# Patient Record
Sex: Male | Born: 1985 | Race: Black or African American | Hispanic: No | Marital: Single | Smoking: Current every day smoker
Health system: Southern US, Community
[De-identification: ages and names within clinical notes are randomized; demographics above are authoritative.]

## PROBLEM LIST (undated history)

## (undated) HISTORY — PX: FACIAL FRACTURE SURGERY: SHX1570

---
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
## 2006-03-25 ENCOUNTER — Encounter: Admission: RE | Admit: 2006-03-25 | Discharge: 2006-03-25 | Payer: Self-pay | Admitting: Occupational Medicine

## 2009-01-03 ENCOUNTER — Emergency Department (HOSPITAL_COMMUNITY): Admission: EM | Admit: 2009-01-03 | Discharge: 2009-01-03 | Payer: Self-pay | Admitting: Emergency Medicine

## 2010-03-30 ENCOUNTER — Inpatient Hospital Stay (HOSPITAL_COMMUNITY)
Admission: EM | Admit: 2010-03-30 | Discharge: 2010-04-01 | Payer: Self-pay | Source: Home / Self Care | Admitting: Emergency Medicine

## 2010-03-30 ENCOUNTER — Encounter: Payer: Self-pay | Admitting: Emergency Medicine

## 2010-06-29 ENCOUNTER — Emergency Department (HOSPITAL_COMMUNITY)
Admission: EM | Admit: 2010-06-29 | Discharge: 2010-06-29 | Disposition: A | Discharge status: Home / Self Care | Payer: No Typology Code available for payment source | Attending: Emergency Medicine | Admitting: Emergency Medicine

## 2010-06-29 ENCOUNTER — Emergency Department (HOSPITAL_COMMUNITY): Payer: No Typology Code available for payment source

## 2010-06-29 DIAGNOSIS — M542 Cervicalgia: Secondary | ICD-10-CM | POA: Insufficient documentation

## 2010-06-29 DIAGNOSIS — M545 Low back pain, unspecified: Secondary | ICD-10-CM | POA: Insufficient documentation

## 2010-06-29 DIAGNOSIS — R51 Headache: Secondary | ICD-10-CM | POA: Insufficient documentation

## 2010-06-29 DIAGNOSIS — M25519 Pain in unspecified shoulder: Secondary | ICD-10-CM | POA: Insufficient documentation

## 2010-07-25 LAB — CBC
HCT: 35.4 % — ABNORMAL LOW (ref 39.0–52.0)
Hemoglobin: 12.9 g/dL — ABNORMAL LOW (ref 13.0–17.0)
Hemoglobin: 11.5 g/dL — ABNORMAL LOW (ref 13.0–17.0)
MCHC: 32.8 g/dL (ref 30.0–36.0)
MCV: 84.8 fL (ref 78.0–100.0)
Platelets: 162 10*3/uL (ref 150–400)
RBC: 4.64 MIL/uL (ref 4.22–5.81)
RBC: 4.28 MIL/uL (ref 4.22–5.81)
RDW: 14.8 % (ref 11.5–15.5)
WBC: 7.5 10*3/uL (ref 4.0–10.5)
WBC: 4.8 10*3/uL (ref 4.0–10.5)

## 2010-07-25 LAB — BASIC METABOLIC PANEL
BUN: 9 mg/dL (ref 6–23)
BUN: 5 mg/dL — ABNORMAL LOW (ref 6–23)
Chloride: 107 mEq/L (ref 96–112)
Creatinine, Ser: 1.09 mg/dL (ref 0.4–1.5)
Creatinine, Ser: 0.84 mg/dL (ref 0.4–1.5)
GFR calc Af Amer: >60 mL/min (ref >60)
Glucose, Bld: 136 mg/dL — ABNORMAL HIGH (ref 70–99)
Sodium: 141 mEq/L (ref 135–145)

## 2010-07-25 LAB — DIFFERENTIAL
Basophils Relative: 0 % (ref 0–1)
Eosinophils Absolute: 0.1 10*3/uL (ref 0.0–0.7)
Eosinophils Relative: 2 % (ref 0–5)
Lymphs Abs: 2.2 10*3/uL (ref 0.7–4.0)
Monocytes Relative: 4 % (ref 3–12)
Neutrophils Relative %: 65 % (ref 43–77)

## 2010-08-19 LAB — COMPREHENSIVE METABOLIC PANEL
Albumin: 4.4 g/dL (ref 3.5–5.2)
Alkaline Phosphatase: 49 U/L (ref 39–117)
Calcium: 9.4 mg/dL (ref 8.4–10.5)
GFR calc Af Amer: >60 mL/min (ref >60)
Glucose, Bld: 97 mg/dL (ref 70–99)
Total Protein: 7.5 g/dL (ref 6.0–8.3)

## 2010-08-19 LAB — CBC
MCHC: 33.3 g/dL (ref 30.0–36.0)
MCV: 84 fL (ref 78.0–100.0)
Platelets: 150 10*3/uL (ref 150–400)
RDW: 15.7 % — ABNORMAL HIGH (ref 11.5–15.5)
WBC: 3.4 10*3/uL — ABNORMAL LOW (ref 4.0–10.5)

## 2010-08-19 LAB — DIFFERENTIAL
Basophils Absolute: 0 10*3/uL (ref 0.0–0.1)
Basophils Relative: 1 % (ref 0–1)
Eosinophils Absolute: 0.1 10*3/uL (ref 0.0–0.7)
Lymphocytes Relative: 41 % (ref 12–46)
Monocytes Relative: 6 % (ref 3–12)
Neutrophils Relative %: 50 % (ref 43–77)

## 2010-08-19 LAB — URINALYSIS, ROUTINE W REFLEX MICROSCOPIC
Bilirubin Urine: NEGATIVE
Glucose, UA: NEGATIVE mg/dL
Hgb urine dipstick: NEGATIVE
Nitrite: NEGATIVE
Specific Gravity, Urine: 1.013 (ref 1.005–1.030)
pH: 7.5 (ref 5.0–8.0)

## 2012-07-29 ENCOUNTER — Encounter (HOSPITAL_COMMUNITY): Payer: Self-pay | Admitting: Family Medicine

## 2012-07-29 ENCOUNTER — Emergency Department (HOSPITAL_COMMUNITY)
Admission: EM | Admit: 2012-07-29 | Discharge: 2012-07-29 | Disposition: A | Discharge status: Home / Self Care | Payer: Self-pay | Attending: Emergency Medicine | Admitting: Emergency Medicine

## 2012-07-29 DIAGNOSIS — N342 Other urethritis: Secondary | ICD-10-CM | POA: Insufficient documentation

## 2012-07-29 DIAGNOSIS — R300 Dysuria: Secondary | ICD-10-CM | POA: Insufficient documentation

## 2012-07-29 DIAGNOSIS — IMO0002 Reserved for concepts with insufficient information to code with codable children: Secondary | ICD-10-CM | POA: Insufficient documentation

## 2012-07-29 DIAGNOSIS — F17200 Nicotine dependence, unspecified, uncomplicated: Secondary | ICD-10-CM | POA: Insufficient documentation

## 2012-07-29 MED ORDER — CEFTRIAXONE SODIUM 250 MG IJ SOLR
250.0000 mg | Freq: Once | INTRAMUSCULAR | Status: AC
  Administered 2012-07-29: 250 mg via INTRAMUSCULAR
  Filled 2012-07-29: qty 250

## 2012-07-29 MED ORDER — AZITHROMYCIN 250 MG PO TABS
1000.0000 mg | ORAL_TABLET | Freq: Once | ORAL | Status: AC
  Administered 2012-07-29: 1000 mg via ORAL
  Filled 2012-07-29: qty 4

## 2012-07-29 NOTE — ED Provider Notes (Signed)
History     CSN: [id number]  Arrival date & time 07/29/12  0551   First MD Initiated Contact with Patient 07/29/12 0602      Chief Complaint  Patient presents with  . Penile Discharge    (Consider location/radiation/quality/duration/timing/severity/associated sxs/prior treatment) HPI Comments: Patient presents with complaint of penile discharge for the past 3 days. Patient is sexually active with a male partner. Patient states that his partner has been complaining of burning with urination for several weeks. Patient describes the discharge as brown with red tinge. He states that it burns with urination and with movement of the penile shaft. He denies skin changes. No fevers, nausea or vomiting. Onset of symptoms gradual. Course is gradually worsening. Nothing makes symptoms.  Patient is a 27 y.o. male presenting with penile discharge. The history is provided by the patient.  Penile Discharge Pertinent negatives include no arthralgias, fever, rash or sore throat.    History reviewed. No pertinent past medical history.  Past Surgical History  Procedure Laterality Date  . Facial fracture surgery      No family history on file.  History  Substance Use Topics  . Smoking status: Current Every Day Smoker -- 0.50 packs/day    Types: Cigarettes  . Smokeless tobacco: Not on file  . Alcohol Use: Yes     Comment: Occasionally      Review of Systems  Constitutional: Negative for fever.  HENT: Negative for sore throat.   Eyes: Negative for discharge.  Gastrointestinal: Negative for rectal pain.  Genitourinary: Positive for dysuria, discharge and penile pain. Negative for frequency, genital sores and testicular pain.  Musculoskeletal: Negative for arthralgias.  Skin: Negative for rash.  Hematological: Negative for adenopathy.    Allergies  Review of patient's allergies indicates no known allergies.  Home Medications   Current Outpatient Rx  Name  Route  Sig  Dispense   Refill  . Multiple Vitamin (MULTIVITAMIN WITH MINERALS) TABS   Oral   Take 1 tablet by mouth daily. OTC  One a day Men's           BP 113/68  Pulse 62  Temp(Src) 98.6 F (37 C) (Oral)  SpO2 99%  Physical Exam  Nursing note and vitals reviewed. Constitutional: He appears well-developed and well-nourished.  HENT:  Head: Normocephalic and atraumatic.  Eyes: Conjunctivae are normal.  Neck: Normal range of motion. Neck supple.  Pulmonary/Chest: No respiratory distress.  Genitourinary: Testes normal. Penile tenderness present. No penile erythema. Discharge found.  Neurological: He is alert.  Skin: Skin is warm and dry.  Psychiatric: He has a normal mood and affect.    ED Course  Procedures (including critical care time)  Labs Reviewed  GC/CHLAMYDIA PROBE AMP   No results found.   1. Urethritis     6:24 AM Patient seen and examined. Work-up initiated. Medications ordered.   Vital signs reviewed and are as follows: Filed Vitals:   07/29/12 0603  BP: 113/68  Pulse: 62  Temp: 98.6 F (37 C)   Pt seen and examined.  Will test and treat for STD exposure.  Patient counseled on safe sexual practices.  Told them that they should not have sexual contact for next 7 days and that they need to inform sexual partners so that they can get tested and treated as well.  Urged f/u with [place] [facility] for HIV and syphilis testing.  Patient verbalizes understanding and agrees with plan.     MDM  Patient with clinical  signs and symptoms of urethritis. Treated in emergency department. No systemic symptoms of illness.        Renne Crigler, PA-C 07/29/12 0630

## 2012-07-29 NOTE — ED Provider Notes (Signed)
Medical screening examination/treatment/procedure(s) were performed by non-physician practitioner and as supervising physician I was immediately available for consultation/collaboration.  Jones Skene, M.D.     Jones Skene, MD 07/29/12 [phone]

## 2012-07-29 NOTE — ED Notes (Addendum)
Patient states that he has had white penile discharge on Saturday. States that discharge changed to brown color since Saturday. Reports pain with urination and swelling around head of penis and that when he wiped he removed "a scab." Went to health department yesterday and was told that an appointment was needed. States that pain is getting worse.

## 2012-07-31 LAB — GC/CHLAMYDIA PROBE AMP: CT Probe RNA: NEGATIVE

## 2013-03-25 ENCOUNTER — Ambulatory Visit (INDEPENDENT_AMBULATORY_CARE_PROVIDER_SITE_OTHER): Payer: BC Managed Care – PPO | Admitting: Emergency Medicine

## 2013-03-25 VITALS — BP 100/62 | HR 64 | Temp 98.9°F | Resp 18 | Ht 71.0 in | Wt 141.0 lb

## 2013-03-25 DIAGNOSIS — R369 Urethral discharge, unspecified: Secondary | ICD-10-CM

## 2013-03-25 DIAGNOSIS — Z202 Contact with and (suspected) exposure to infections with a predominantly sexual mode of transmission: Secondary | ICD-10-CM

## 2013-03-25 DIAGNOSIS — Z2089 Contact with and (suspected) exposure to other communicable diseases: Secondary | ICD-10-CM

## 2013-03-25 MED ORDER — CEFTRIAXONE SODIUM 1 G IJ SOLR: 250.0000 mg | INTRAMUSCULAR | Status: DC

## 2013-03-25 MED ORDER — DOXYCYCLINE HYCLATE 100 MG PO CAPS: 100.0000 mg | ORAL_CAPSULE | Freq: Two times a day (BID) | ORAL | Status: DC

## 2013-03-25 MED ORDER — CEFTRIAXONE SODIUM 1 G IJ SOLR: 250.0000 mg | Freq: Once | INTRAMUSCULAR | Status: DC

## 2013-03-25 MED ORDER — CEFTRIAXONE SODIUM 1 G IJ SOLR
250.0000 mg | Freq: Once | INTRAMUSCULAR | Status: AC
  Administered 2013-03-25: 250 mg via INTRAMUSCULAR

## 2013-03-25 NOTE — Progress Notes (Signed)
[facility]: [address] [place] [address] [phone]  Date:  03/25/2013   Name:  Terry Maddox   DOB:  1985-07-09   MRN:  [id number]  PCP:  No PCP Per Patient    Chief Complaint: discharge from penis and tingle after urination   History of Present Illness:  Terry Maddox is a 27 y.o. very pleasant male patient who presents with the following:  Has discharge for 1-2 weeks.  No fever or chills, no rash or genital lesions.  Sexually active without condoms.  Prior history of STD.  Some dysuria.  No improvement with over the counter medications or other home remedies. Denies other complaint or health concern today.   There are no active problems to display for this patient.   History reviewed. No pertinent past medical history.  Past Surgical History  Procedure Laterality Date  . Facial fracture surgery      History  Substance Use Topics  . Smoking status: Current Every Day Smoker -- 0.50 packs/day    Types: Cigarettes  . Smokeless tobacco: Not on file  . Alcohol Use: Yes     Comment: Occasionally    History reviewed. No pertinent family history.  No Known Allergies  Medication list has been reviewed and updated.  Current Outpatient Prescriptions on File Prior to Visit  Medication Sig Dispense Refill  . Multiple Vitamin (MULTIVITAMIN WITH MINERALS) TABS Take 1 tablet by mouth daily. OTC  One a day Men's       No current facility-administered medications on file prior to visit.    Review of Systems:  As per HPI, otherwise negative.    Physical Examination: Filed Vitals:   03/25/13 1504  BP: 100/62  Pulse: 64  Temp: 98.9 F (37.2 C)  Resp: 18   Filed Vitals:   03/25/13 1504  Height: 5\' 11"  (1.803 m)  Weight: 141 lb (63.957 kg)   Body mass index is 19.67 kg/(m^2). Ideal Body Weight: Weight in (lb) to have BMI = 25: 178.9   GEN: WDWN, NAD, Non-toxic, Alert & Oriented x 3 HEENT: Atraumatic, Normocephalic.  Ears and Nose:  No external deformity. EXTR: No clubbing/cyanosis/edema NEURO: Normal gait.  PSYCH: Normally interactive. Conversant. Not depressed or anxious appearing.  Calm demeanor.  Genitalia:  Normal male circumcised  Assessment and Plan: Urethritis STD exposure GC and CT HSV 1&2 Rocephin Doxycycline  Signed,  Phillips Odor, MD

## 2013-03-25 NOTE — Patient Instructions (Signed)
Urethritis, Adult  Urethritis is an inflammation (soreness) of the urethra (the tube exiting from the bladder). It is often caused by germs that may be spread through sexual contact.  TREATMENT   Urethritis will usually respond to antibiotics. These are medications that kill germs. Take all the medicine given to you. You may feel better in a couple days, but TAKE ALL MEDICINE or the infection may not be completely cured and may become more difficult to treat. Response can generally be expected in 7 to 10 days. You may require additional treatment after more testing.  HOME CARE INSTRUCTIONS   Not have sex until the test results are known and treatment is completed.   Know that you may be asked to notify your sex partner when your final test results are back.   Finish all medications as prescribed.   Prevent sexually transmitted infections including AIDS. Practice safe sex. Use condoms.  SEEK MEDICAL CARE IF:    Your symptoms are not improved in 2 to 3 days.   Your symptoms are getting worse.   Your develop abdominal pain.   You develop joint pain.  SEEK IMMEDIATE MEDICAL CARE IF:    You have a fever.   You develop severe pain in the belly, back or side.   You develop repeated vomiting.  TEST RESULTS  Not all test results are available during your visit. If your test results are not back during the visit, make an appointment with your caregiver to find out the results. Do not assume everything is normal if you have not heard from your caregiver or the medical facility. It is important for you to follow-up on all of your test results.  Document Released: 10/24/2000 Document Revised: 07/23/2011 Document Reviewed: 05/16/2009  [employer] Patient Information 2014 [employer].

## 2013-03-25 NOTE — Addendum Note (Signed)
Addended by: Okey Regal on: 03/25/2013 03:39 PM   Modules accepted: Orders

## 2013-03-27 ENCOUNTER — Other Ambulatory Visit: Payer: Self-pay | Admitting: Emergency Medicine

## 2013-03-28 LAB — HERPES SIMPLEX VIRUS CULTURE: Organism ID, Bacteria: DETECTED

## 2013-03-29 ENCOUNTER — Other Ambulatory Visit: Payer: Self-pay | Admitting: Emergency Medicine

## 2013-03-29 MED ORDER — VALACYCLOVIR HCL 1 G PO TABS: 1000.0000 mg | ORAL_TABLET | Freq: Every day | ORAL | Status: DC

## 2013-03-30 ENCOUNTER — Telehealth: Payer: Self-pay | Admitting: Radiology

## 2013-03-30 NOTE — Telephone Encounter (Signed)
The mother of the baby was almost certainly tested for HSV during her pregnancy.  And unless she was actively having an outbreak at the time of delivery, risk of transmission is low.  Would advise mother of baby to contact her OBGYN and/or baby's pediatrician if they have concerns

## 2013-03-30 NOTE — Telephone Encounter (Signed)
Patient called back, reports his girlfriend has had a baby recently, he is concerned about the baby. What should I advise him? 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

## 2013-03-30 NOTE — Telephone Encounter (Signed)
He is concerned about the baby contracting HSV at birth.

## 2013-03-30 NOTE — Telephone Encounter (Signed)
What is his concern?  There should be no risk of transmitting chlamydia as this is a sexually transmitted infection.  HSV is transmitted by skin to skin contact, generally only when there is an outbreak.  If he has started taking valtrex daily as Dr. Dareen Piano suggested in his lab note, his risk of outbreak and transmission should be lower.  If he has a more specific concern, I am happy to try and answer his question.

## 2013-03-30 NOTE — Telephone Encounter (Signed)
Thank you I have called him to advise.  

## 2013-03-31 ENCOUNTER — Telehealth: Payer: Self-pay | Admitting: Family Medicine

## 2013-03-31 NOTE — Telephone Encounter (Signed)
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  There is no reason for concern about passing genital herpes to family members, just sexual partners.

## 2013-03-31 NOTE — Telephone Encounter (Signed)
Patient calling to find out which herpes do he have cause he has kids and dont want to pass to family

## 2013-04-01 NOTE — Telephone Encounter (Signed)
Patient advised. He is encouraged to return to clinic for these issues, as he continues to call with questions.

## 2013-04-03 ENCOUNTER — Telehealth: Payer: Self-pay | Admitting: Family Medicine

## 2013-04-03 NOTE — Telephone Encounter (Signed)
lmom to see if patient was going to come in to talk to Dr Dareen Piano about herpes he's been calling multiple times about lab

## 2013-05-18 ENCOUNTER — Emergency Department (HOSPITAL_COMMUNITY)
Admission: EM | Admit: 2013-05-18 | Discharge: 2013-05-18 | Disposition: A | Discharge status: Home / Self Care | Payer: BC Managed Care – PPO | Attending: Emergency Medicine | Admitting: Emergency Medicine

## 2013-05-18 ENCOUNTER — Encounter (HOSPITAL_COMMUNITY): Payer: Self-pay | Admitting: Emergency Medicine

## 2013-05-18 DIAGNOSIS — J069 Acute upper respiratory infection, unspecified: Secondary | ICD-10-CM | POA: Insufficient documentation

## 2013-05-18 DIAGNOSIS — IMO0001 Reserved for inherently not codable concepts without codable children: Secondary | ICD-10-CM | POA: Insufficient documentation

## 2013-05-18 DIAGNOSIS — J029 Acute pharyngitis, unspecified: Secondary | ICD-10-CM

## 2013-05-18 DIAGNOSIS — F17200 Nicotine dependence, unspecified, uncomplicated: Secondary | ICD-10-CM | POA: Insufficient documentation

## 2013-05-18 DIAGNOSIS — R6883 Chills (without fever): Secondary | ICD-10-CM | POA: Insufficient documentation

## 2013-05-18 LAB — RAPID STREP SCREEN (MED CTR MEBANE ONLY): Streptococcus, Group A Screen (Direct): NEGATIVE

## 2013-05-18 NOTE — Discharge Instructions (Signed)
Rest, stay well hydrated. Take ibuprofen 600 mg every 6 hours. Take mucinex over the counter twice daily. Use nasal saline.  Cool Mist Vaporizers Vaporizers may help relieve the symptoms of a cough and cold. They add moisture to the air, which helps mucus to become thinner and less sticky. This makes it easier to breathe and cough up secretions. Cool mist vaporizers do not cause serious burns like hot mist vaporizers ("steamers, humidifiers"). Vaporizers have not been proved to show they help with colds. You should not use a vaporizer if you are allergic to mold.  HOME CARE INSTRUCTIONS  Follow the package instructions for the vaporizer.  Do not use anything other than distilled water in the vaporizer.  Do not run the vaporizer all of the time. This can cause mold or bacteria to grow in the vaporizer.  Clean the vaporizer after each time it is used.  Clean and dry the vaporizer well before storing it.  Stop using the vaporizer if worsening respiratory symptoms develop. Document Released: 01/26/2004 Document Revised: 12/31/2012 Document Reviewed: 09/17/2012 [facility] Patient Information 2014 [place], [place].  Salt Water Gargle This solution will help make your mouth and throat feel better. HOME CARE INSTRUCTIONS   Mix 1 teaspoon of salt in 8 ounces of warm water.  Gargle with this solution as much or often as you need or as directed. Swish and gargle gently if you have any sores or wounds in your mouth.  Do not swallow this mixture. Document Released: 02/02/2004 Document Revised: 07/23/2011 Document Reviewed: 06/25/2008 [facility] Patient Information 2014 [place], [place].  Viral and Bacterial Pharyngitis Pharyngitis is soreness (inflammation) or infection of the pharynx. It is also called a sore throat. CAUSES  Most sore throats are caused by viruses and are part of a cold. However, some sore throats are caused by strep and other bacteria. Sore throats can also be caused by post nasal  drip from draining sinuses, allergies and sometimes from sleeping with an open mouth. Infectious sore throats can be spread from person to person by coughing, sneezing and sharing cups or eating utensils. TREATMENT  Sore throats that are viral usually last 3-4 days. Viral illness will get better without medications (antibiotics). Strep throat and other bacterial infections will usually begin to get better about 24-48 hours after you begin to take antibiotics. HOME CARE INSTRUCTIONS   If the caregiver feels there is a bacterial infection or if there is a positive strep test, they will prescribe an antibiotic. The full course of antibiotics must be taken. If the full course of antibiotic is not taken, you or your child may become ill again. If you or your child has strep throat and do not finish all of the medication, serious heart or kidney diseases may develop.  Drink enough water and fluids to keep your urine clear or pale yellow.  Only take over-the-counter or prescription medicines for pain, discomfort or fever as directed by your caregiver.  Get lots of rest.  Gargle with salt water ( tsp. of salt in a glass of water) as often as every 1-2 hours as you need for comfort.  Hard candies may soothe the throat if individual is not at risk for choking. Throat sprays or lozenges may also be used. SEEK MEDICAL CARE IF:   Large, tender lumps in the neck develop.  A rash develops.  Green, yellow-brown or bloody sputum is coughed up.  Your baby is older than 3 months with a rectal temperature of 100.5 F (38.1 C)  or higher for more than 1 day. SEEK IMMEDIATE MEDICAL CARE IF:   A stiff neck develops.  You or your child are drooling or unable to swallow liquids.  You or your child are vomiting, unable to keep medications or liquids down.  You or your child has severe pain, unrelieved with recommended medications.  You or your child are having difficulty breathing (not due to stuffy  nose).  You or your child are unable to fully open your mouth.  You or your child develop redness, swelling, or severe pain anywhere on the neck.  You have a fever.  Your baby is older than 3 months with a rectal temperature of 102 F (38.9 C) or higher.  Your baby is 75 months old or younger with a rectal temperature of 100.4 F (38 C) or higher. MAKE SURE YOU:   Understand these instructions.  Will watch your condition.  Will get help right away if you are not doing well or get worse. Document Released: 04/30/2005 Document Revised: 07/23/2011 Document Reviewed: 07/28/2007 [facility] Patient Information 2014 [place], [place].  Upper Respiratory Infection, Adult An upper respiratory infection (URI) is also sometimes known as the common cold. The upper respiratory tract includes the nose, sinuses, throat, trachea, and bronchi. Bronchi are the airways leading to the lungs. Most people improve within 1 week, but symptoms can last up to 2 weeks. A residual cough may last even longer.  CAUSES Many different viruses can infect the tissues lining the upper respiratory tract. The tissues become irritated and inflamed and often become very moist. Mucus production is also common. A cold is contagious. You can easily spread the virus to others by oral contact. This includes kissing, sharing a glass, coughing, or sneezing. Touching your mouth or nose and then touching a surface, which is then touched by another person, can also spread the virus. SYMPTOMS  Symptoms typically develop 1 to 3 days after you come in contact with a cold virus. Symptoms vary from person to person. They may include:  Runny nose.  Sneezing.  Nasal congestion.  Sinus irritation.  Sore throat.  Loss of voice (laryngitis).  Cough.  Fatigue.  Muscle aches.  Loss of appetite.  Headache.  Low-grade fever. DIAGNOSIS  You might diagnose your own cold based on familiar symptoms, since most people get a cold 2 to  3 times a year. Your caregiver can confirm this based on your exam. Most importantly, your caregiver can check that your symptoms are not due to another disease such as strep throat, sinusitis, pneumonia, asthma, or epiglottitis. Blood tests, throat tests, and X-rays are not necessary to diagnose a common cold, but they may sometimes be helpful in excluding other more serious diseases. Your caregiver will decide if any further tests are required. RISKS AND COMPLICATIONS  You may be at risk for a more severe case of the common cold if you smoke cigarettes, have chronic heart disease (such as heart failure) or lung disease (such as asthma), or if you have a weakened immune system. The very young and very old are also at risk for more serious infections. Bacterial sinusitis, middle ear infections, and bacterial pneumonia can complicate the common cold. The common cold can worsen asthma and chronic obstructive pulmonary disease (COPD). Sometimes, these complications can require emergency medical care and may be life-threatening. PREVENTION  The best way to protect against getting a cold is to practice good hygiene. Avoid oral or hand contact with people with cold symptoms. Wash your hands  often if contact occurs. There is no clear evidence that vitamin C, vitamin E, echinacea, or exercise reduces the chance of developing a cold. However, it is always recommended to get plenty of rest and practice good nutrition. TREATMENT  Treatment is directed at relieving symptoms. There is no cure. Antibiotics are not effective, because the infection is caused by a virus, not by bacteria. Treatment may include:  Increased fluid intake. Sports drinks offer valuable electrolytes, sugars, and fluids.  Breathing heated mist or steam (vaporizer or shower).  Eating chicken soup or other clear broths, and maintaining good nutrition.  Getting plenty of rest.  Using gargles or lozenges for comfort.  Controlling fevers with  ibuprofen or acetaminophen as directed by your caregiver.  Increasing usage of your inhaler if you have asthma. Zinc gel and zinc lozenges, taken in the first 24 hours of the common cold, can shorten the duration and lessen the severity of symptoms. Pain medicines may help with fever, muscle aches, and throat pain. A variety of non-prescription medicines are available to treat congestion and runny nose. Your caregiver can make recommendations and may suggest nasal or lung inhalers for other symptoms.  HOME CARE INSTRUCTIONS   Only take over-the-counter or prescription medicines for pain, discomfort, or fever as directed by your caregiver.  Use a warm mist humidifier or inhale steam from a shower to increase air moisture. This may keep secretions moist and make it easier to breathe.  Drink enough water and fluids to keep your urine clear or pale yellow.  Rest as needed.  Return to work when your temperature has returned to normal or as your caregiver advises. You may need to stay home longer to avoid infecting others. You can also use a face mask and careful hand washing to prevent spread of the virus. SEEK MEDICAL CARE IF:   After the first few days, you feel you are getting worse rather than better.  You need your caregiver's advice about medicines to control symptoms.  You develop chills, worsening shortness of breath, or brown or red sputum. These may be signs of pneumonia.  You develop yellow or brown nasal discharge or pain in the face, especially when you bend forward. These may be signs of sinusitis.  You develop a fever, swollen neck glands, pain with swallowing, or white areas in the back of your throat. These may be signs of strep throat. SEEK IMMEDIATE MEDICAL CARE IF:   You have a fever.  You develop severe or persistent headache, ear pain, sinus pain, or chest pain.  You develop wheezing, a prolonged cough, cough up blood, or have a change in your usual mucus (if you have  chronic lung disease).  You develop sore muscles or a stiff neck. Document Released: 10/24/2000 Document Revised: 07/23/2011 Document Reviewed: 09/01/2010 [facility] Patient Information 2014 [place], [place].

## 2013-05-18 NOTE — ED Notes (Signed)
Pt in c/o sore throat x2 days with body aches and chills

## 2013-05-18 NOTE — ED Notes (Signed)
Pt states understanding of discharge instructions 

## 2013-05-18 NOTE — ED Provider Notes (Signed)
Medical screening examination/treatment/procedure(s) were performed by non-physician practitioner and as supervising physician I was immediately available for consultation/collaboration.  EKG Interpretation   None         Loren Raceravid Haizley Cannella, MD 05/18/13 2315

## 2013-05-18 NOTE — ED Provider Notes (Signed)
CSN: [id number]     Arrival date & time 05/18/13  0239 History   First MD Initiated Contact with Patient 05/18/13 [phone]     Chief Complaint  Patient presents with  . Sore Throat   (Consider location/radiation/quality/duration/timing/severity/associated sxs/prior Treatment) HPI Comments: Patient is a 28 year old male who presents to the emergency department complaining of sore throat x3-4 days. Admits to associated nasal congestion, postnasal drip, body aches and chills. Denies fever. He is a slight nonproductive cough which began today. He has tried taking multiple over-the-counter medications without any relief. Denies sick contacts.  Patient is a 28 y.o. male presenting with pharyngitis. The history is provided by the patient.  Sore Throat Associated symptoms include arthralgias, chills, congestion, coughing, myalgias and a sore throat. Pertinent negatives include no fever.    History reviewed. No pertinent past medical history. Past Surgical History  Procedure Laterality Date  . Facial fracture surgery     History reviewed. No pertinent family history. History  Substance Use Topics  . Smoking status: Current Every Day Smoker -- 0.50 packs/day    Types: Cigarettes  . Smokeless tobacco: Not on file  . Alcohol Use: Yes     Comment: Occasionally    Review of Systems  Constitutional: Positive for chills. Negative for fever.  HENT: Positive for congestion and sore throat.   Respiratory: Positive for cough.   Musculoskeletal: Positive for arthralgias and myalgias.    Allergies  Review of patient's allergies indicates no known allergies.  Home Medications   Current Outpatient Rx  Name  Route  Sig  Dispense  Refill  . doxycycline (VIBRAMYCIN) 100 MG capsule   Oral   Take 1 capsule (100 mg total) by mouth 2 (two) times daily.   20 capsule   0   . Multiple Vitamin (MULTIVITAMIN WITH MINERALS) TABS   Oral   Take 1 tablet by mouth daily. OTC  One a day Men's         .  valACYclovir (VALTREX) 1000 MG tablet   Oral   Take 1 tablet (1,000 mg total) by mouth daily.   30 tablet   12    BP 131/77  Pulse 80  Temp(Src) 98.6 F (37 C) (Oral)  Resp 20  Wt 145 lb (65.772 kg)  SpO2 100% Physical Exam  Nursing note and vitals reviewed. Constitutional: He is oriented to person, place, and time. He appears well-developed and well-nourished. No distress.  HENT:  Head: Normocephalic and atraumatic.  Right Ear: Tympanic membrane and ear canal normal.  Left Ear: Tympanic membrane and ear canal normal.  Nose: Mucosal edema present.  Mouth/Throat: Uvula is midline and mucous membranes are normal. Posterior oropharyngeal erythema present. No oropharyngeal exudate or posterior oropharyngeal edema.  Post nasal drip.  Eyes: Conjunctivae are normal.  Neck: Normal range of motion. Neck supple.  Cardiovascular: Normal rate, regular rhythm and normal heart sounds.   Pulmonary/Chest: Effort normal and breath sounds normal.  Musculoskeletal: Normal range of motion. He exhibits no edema.  Lymphadenopathy:    He has no cervical adenopathy.  Neurological: He is alert and oriented to person, place, and time.  Skin: Skin is warm and dry. He is not diaphoretic.  Psychiatric: He has a normal mood and affect. His behavior is normal.    ED Course  Procedures (including critical care time) Labs Review Labs Reviewed  RAPID STREP SCREEN  CULTURE, GROUP A STREP   Imaging Review No results found.  EKG Interpretation   None  MDM   1. URI (upper respiratory infection)   2. Pharyngitis    Pt presenting with sore throat, URI s/s. He is well appearing, NAD, normal VS. Rapid strep negative. Lungs clear. Discussed symptomatic tx. Return precautions given. Patient states understanding of treatment care plan and is agreeable.     Trevor MaceRobyn M Albert, PA-C 05/18/13 [phone]

## 2013-05-19 LAB — CULTURE, GROUP A STREP

## 2015-05-07 ENCOUNTER — Emergency Department (HOSPITAL_COMMUNITY)
Admission: EM | Admit: 2015-05-07 | Discharge: 2015-05-07 | Disposition: A | Discharge status: Home / Self Care | Payer: PRIVATE HEALTH INSURANCE | Attending: Emergency Medicine | Admitting: Emergency Medicine

## 2015-05-07 ENCOUNTER — Encounter (HOSPITAL_COMMUNITY): Payer: Self-pay | Admitting: *Deleted

## 2015-05-07 ENCOUNTER — Emergency Department (HOSPITAL_COMMUNITY): Payer: PRIVATE HEALTH INSURANCE

## 2015-05-07 DIAGNOSIS — Y9289 Other specified places as the place of occurrence of the external cause: Secondary | ICD-10-CM | POA: Diagnosis not present

## 2015-05-07 DIAGNOSIS — F17210 Nicotine dependence, cigarettes, uncomplicated: Secondary | ICD-10-CM | POA: Diagnosis not present

## 2015-05-07 DIAGNOSIS — Y9301 Activity, walking, marching and hiking: Secondary | ICD-10-CM | POA: Diagnosis not present

## 2015-05-07 DIAGNOSIS — Z79899 Other long term (current) drug therapy: Secondary | ICD-10-CM | POA: Insufficient documentation

## 2015-05-07 DIAGNOSIS — W19XXXA Unspecified fall, initial encounter: Secondary | ICD-10-CM

## 2015-05-07 DIAGNOSIS — Y998 Other external cause status: Secondary | ICD-10-CM | POA: Diagnosis not present

## 2015-05-07 DIAGNOSIS — S6991XA Unspecified injury of right wrist, hand and finger(s), initial encounter: Secondary | ICD-10-CM | POA: Diagnosis present

## 2015-05-07 DIAGNOSIS — W108XXA Fall (on) (from) other stairs and steps, initial encounter: Secondary | ICD-10-CM | POA: Insufficient documentation

## 2015-05-07 DIAGNOSIS — M79644 Pain in right finger(s): Secondary | ICD-10-CM

## 2015-05-07 DIAGNOSIS — Y9389 Activity, other specified: Secondary | ICD-10-CM | POA: Insufficient documentation

## 2015-05-07 MED ORDER — KETOROLAC TROMETHAMINE 60 MG/2ML IM SOLN
60.0000 mg | Freq: Once | INTRAMUSCULAR | Status: AC
  Administered 2015-05-07: 60 mg via INTRAMUSCULAR
  Filled 2015-05-07: qty 2

## 2015-05-07 MED ORDER — IBUPROFEN 800 MG PO TABS: 800.0000 mg | ORAL_TABLET | Freq: Three times a day (TID) | ORAL | Status: DC

## 2015-05-07 MED ORDER — OXYCODONE-ACETAMINOPHEN 5-325 MG PO TABS
2.0000 | ORAL_TABLET | Freq: Once | ORAL | Status: AC
  Administered 2015-05-07: 2 via ORAL
  Filled 2015-05-07: qty 2

## 2015-05-07 MED ORDER — OXYCODONE-ACETAMINOPHEN 5-325 MG PO TABS: 1.0000 | ORAL_TABLET | ORAL | Status: DC | PRN

## 2015-05-07 NOTE — ED Notes (Signed)
Declined W/C at D/C and was escorted to lobby by RN. 

## 2015-05-07 NOTE — ED Notes (Signed)
Pt reports he slipped and fell down stairs ,but can not tell how many steeps he fell down. Pt now has pain to RT thumb

## 2015-05-07 NOTE — Discharge Instructions (Signed)
You have been seen today for a fall and thumb pain. Your imaging showed no abnormalities. Follow up with PCP as needed. Return to ED should symptoms worsen. Follow-up with orthopedics if symptoms do not improve. Apply ice and 15 minute intervals to help with inflammation. Ibuprofen or naproxen can be used for pain and swelling.   Emergency Department Resource Guide 1) Find a Doctor and Pay Out of Pocket Although you won't have to find out who is covered by your insurance plan, it is a good idea to ask around and get recommendations. You will then need to call the office and see if the doctor you have chosen will accept you as a new patient and what types of options they offer for patients who are self-pay. Some doctors offer discounts or will set up payment plans for their patients who do not have insurance, but you will need to ask so you aren't surprised when you get to your appointment.  2) Contact Your Local Health Department Not all health departments have doctors that can see patients for sick visits, but many do, so it is worth a call to see if yours does. If you don't know where your local health department is, you can check in your phone book. The CDC also has a tool to help you locate your state's health department, and many state websites also have listings of all of their local health departments.  3) Find a Walk-in Clinic If your illness is not likely to be very severe or complicated, you may want to try a walk in clinic. These are popping up all over the country in pharmacies, drugstores, and shopping centers. They're usually staffed by nurse practitioners or physician assistants that have been trained to treat common illnesses and complaints. They're usually fairly quick and inexpensive. However, if you have serious medical issues or chronic medical problems, these are probably not your best option.  No Primary Care Doctor: - Call Health Connect at  [phone] - they can help you locate a  primary care doctor that  accepts your insurance, provides certain services, etc. - Physician Referral Service- [phone]  Chronic Pain Problems: Organization         Address  Phone   Notes  [facility]  [phone] Patients need to be referred by their primary care doctor.   Medication Assistance: Organization         Address  Phone   Notes  [facility] [address] [place], [place] [address] [phone] --Must be a resident of [place] -- Must have NO insurance coverage whatsoever (no Medicaid/ Medicare, etc.) -- The pt. MUST have a primary care doctor that directs their care regularly and follows them in the community   MedAssist  [phone]   [employer]  [phone]    Agencies that provide inexpensive medical care: Organization         Address  Phone   Notes  [facility]  [phone]   [facility]    [phone]   [facility] [address] [place], [place] [address] [phone]   [facility] [address], [place] [phone]   [facility]anned [facility]    [phone]   [facility]    [phone]   [facility] and [facility]  [address], [place] Phone:  [phone], Fax:  [phone])  [phone] Hours of Operation:  9 am - 6 pm, M-F.  Also accepts Medicaid/Medicare and self-pay.  [facility] for [place], [address], [place] Phone: [phone], Fax: [phone]. Hours of Operation:  8:30 am - 5:30 pm, M-F.  Also accepts Medicaid and self-pay.  [facility] [place] [address], [place] Phone: [phone]   [place], [place], [place] [phone], Ext. [phone] Mondays & Thursdays: 7-9 AM.  First 15 patients are seen on a first come, first serve basis.    [place]  Providers:  Organization         Address  Phone   Notes  [facility] [address], [place] [phone] Also accepts self-pay patients.  [facility] [address] [place], [place]  [phone]   [place], [address], [place] [phone]   [facility] [address], [place] [phone]   Lucianne Lei [address], [place]   [phone] Only accepts [place] Access [place] patients after they have their name applied to their card.   Self-Pay (no insurance) in [facility]:  Organization         Address  Phone   Notes  Sickle Cell Patients, [facility] Internal [facility] [place] [phone]   [facility] [place] [phone]   [facility] [place]  [place], [place], [place] [phone]   [facility]/Dr. Osei-Bonsu  [address], [place] or [address], [place] [phone] Phone number for both [place] and [place] locations is the same.  Urgent Medical and [facility] [address] [place] [phone]   [facility] [address] [place] or [address] [phone]   [facility] [address], [place] [phone], phone; [phone], fax Sees patients 1st and 3rd Saturday of every month.  Must not qualify for public or private insurance (i.e. Medicaid, Medicare, [place] Health Choice, Veterans' Benefits)  Household income should be no more than 200% of the poverty level The clinic cannot treat you if you are pregnant or think you are pregnant  Sexually transmitted diseases are not treated at the clinic.    Dental Care: Organization         Address  Phone  Notes  [facility] [place] [phone] Accepts children up to age 33 who are enrolled in [place] or [place]; pregnant women with a Medicaid card; and children who have applied for Medicaid or [place] Health Choice, but were declined, whose parents can pay a reduced fee at time of service.  [facility] Department of [facility]  [address], [place] [phone] Accepts children up to age 77 who are enrolled in [place] or [place]; pregnant women with a Medicaid card; and children who have applied for Medicaid or  Health Choice, but were declined, whose parents can pay a reduced fee at time of service.  [facility] Adult [facility] Access PROGRAM  [place] [phone] Patients are seen by appointment only. Walk-ins are not accepted. [place]  will see patients 14 years of age and older. Monday - Tuesday (8am-5pm) Most Wednesdays (8:30-5pm) $30 per visit, cash only  [facility] Adult Dental Access PROGRAM  [address], [facility] [phone] Patients are seen by appointment only. Walk-ins are not accepted. [place] will see patients 45 years of age and older. One Wednesday Evening (Monthly: Volunteer Based).  $30 per visit, cash only  [place]  [phone] for adults; Children under age 38, call Graduate Pediatric Dentistry at [phone]. Children aged 29-14, please call [phone] to request a pediatric application.  Dental services are provided in all areas of dental care including fillings, crowns and bridges, complete and partial dentures, implants, gum treatment, root canals, and extractions. Preventive care is also provided. Treatment is provided to both adults and children. Patients are selected via a lottery and there is often a waiting list.   [facility] [address], [place]  [phone] [email or web address]   [facility]  [address] [place], [place] [phone], Ext. [phone] Second and Fourth Thursday of each month, opens at 6:30 AM; Clinic ends at 9 AM.  Patients are seen on a first-come first-served basis, and a limited number are seen during each clinic.   [facility]  [address] Hillard Danker [place], [place] [phone]   Eligibility Requirements You must have lived in [place], [place], or [place] counties for at least the last three months.   You cannot be eligible for state or federal sponsored [employer], including [employer], [place], or [employer].   You generally cannot be eligible for healthcare insurance through your employer.    How to apply: Eligibility screenings are held every Tuesday and Wednesday afternoon from 1:00 pm until 4:00 pm. You do not need an appointment for the interview!  [facility] [address] [place], [place]   [place]  Welcome Department  [place]  [phone]    Behavioral Health Resources in the Community: Intensive Outpatient Programs Organization         Address  Phone  Notes  [place]. [address] [place], [place] [phone]   [facility] Outpatient [address] [place], [place]   ADS: Alcohol & Drug Svcs [address] [place], [place]   [place] [address]  [place], [place] or [phone]   Substance Abuse Resources Organization         Address  Phone  Notes  Alcohol and Drug Services  [phone]   [place]  [phone]   The [place]   Chinita Pester  [phone]   Residential & Outpatient Substance Abuse Program  [phone]   Psychological Services Organization         Address  Phone  Notes  [facility] [place]  [place]  [phone]   [place] [address], [place] or [phone]    Mobile Crisis Teams Organization         Address  Phone  Notes  [facility], Mobile Crisis Care Unit  [phone]   [facility]ive Psychotherapeutic Services  [address] [place], [place]   [facility] [address] [place] [phone]    Self-Help/Support Groups Organization  Address  Phone             Notes  [place]. of [place] - variety of support groups  [place] Call for more information  Narcotics Anonymous (NA), Caring Services [address], [employer] [place]  2 meetings at this location   Special educational needs teacher         Address  Phone  Notes  ASAP Residential Treatment [place],    [place]  1-3307155621   [facility]  [address], [place] [address]9924, [place], [place]   [place], [place] [phone] Admissions: 8am-3pm M-F  Incentives Substance [place] [address],    [place], [place] [phone]   [facility] [address] [place], [place], [place]   [facility] [address],  [place], [place]   [facility] Programs - Intensive Outpatient [address], [place], [place]   [facility] ([place]  [place], [place] [phone] or [phone]   [facility] Services (RTS) [address] [place], [place] Accepts Medicaid  [facility] [place] [address]  [place] [phone] Substance Abuse/Addiction Treatment   [facility] Organization         Address  Phone  Notes  [facility]  [phone]   Domenic Schwab, PhD [address] Arlis Porta [place], [place]   [address] or [phone]    [place], [place] [phone]   [facility] [address], [place], [place] [phone] Insurance/Medicaid/sponsorship through [facility] and Families [address], [address] [place]                                    [place], [place] [phone] [place] [address], [place] [phone]    Dr. Adele Schilder  [phone]   [facility] Dept. 1) [address], [place] 2) [place] 3)  [place] [address], [place] [phone]  [phone]   [place] [phone] or [phone] (After Hours)

## 2015-05-07 NOTE — ED Notes (Signed)
Patient transported to X-ray 

## 2015-05-07 NOTE — ED Provider Notes (Signed)
CSN: [id number]     Arrival date & time 05/07/15  4098 History  By signing my name below, I, Terry Maddox, attest that this documentation has been prepared under the direction and in the presence of Shawn C. Joy, PA-C. Electronically Signed: Placido Maddox, ED Scribe. 05/07/2015. 9:41 AM.   Chief Complaint  Patient presents with  . Hand Injury   The history is provided by the patient. No language interpreter was used.    HPI Comments: Terry Maddox is a 29 y.o. male who presents to the Emergency Department complaining of constant, 8/10, non-radiating, throbbing, right thumb pain with onset earlier today. Pt notes that he was walking down stairs, slipped and fell down about 3 stairs landing on the effected outstretched hand. He has a small scab to his right index finger which he says was present before the fall and reopened with minimal bleeding present. Pt notes DROM and worsening pain with any movement. He denies taking anything for his symptoms. Pt denies any known drug allergies. He denies head trauma, LOC, N/V, neurologic deficits, or any other pain or complaints.   History reviewed. No pertinent past medical history. Past Surgical History  Procedure Laterality Date  . Facial fracture surgery     History reviewed. No pertinent family history. Social History  Substance Use Topics  . Smoking status: Current Every Day Smoker -- 0.50 packs/day    Types: Cigarettes  . Smokeless tobacco: None  . Alcohol Use: Yes     Comment: Occasionally    Review of Systems  Gastrointestinal: Negative for nausea and vomiting.  Musculoskeletal: Positive for joint swelling (Right thumb) and arthralgias (Right thumb).  Skin: Positive for wound.  Neurological: Negative for syncope and headaches.   Allergies  Review of patient's allergies indicates no known allergies.  Home Medications   Prior to Admission medications   Medication Sig Start Date End Date Taking? Authorizing Provider   valACYclovir (VALTREX) 1000 MG tablet Take 1 tablet (1,000 mg total) by mouth daily. 03/29/13  Yes Carmelina Dane, MD  ibuprofen (ADVIL,MOTRIN) 800 MG tablet Take 1 tablet (800 mg total) by mouth 3 (three) times daily. 05/07/15   Shawn C Joy, PA-C  oxyCODONE-acetaminophen (PERCOCET/ROXICET) 5-325 MG tablet Take 1-2 tablets by mouth every 4 (four) hours as needed for severe pain. 05/07/15   Shawn C Joy, PA-C   BP 128/78 mmHg  Pulse 90  Temp(Src) 99.3 F (37.4 C) (Oral)  Resp 16  Ht  (1.803 m)  Wt 65.772 kg  BMI 20.23 kg/m2  SpO2 99% Physical Exam  Constitutional: He appears well-developed and well-nourished.  HENT:  Head: Normocephalic and atraumatic.  Mouth/Throat: No oropharyngeal exudate.  Eyes: Conjunctivae are normal. Pupils are equal, round, and reactive to light.  Neck: Normal range of motion. Neck supple.  Cardiovascular: Normal rate.   Pulmonary/Chest: Effort normal. No respiratory distress.  Musculoskeletal: He exhibits edema and tenderness (Right thumb).  Limited ROM of right thumb due to pain; full range of motion in the rest of patient's hand and fingers. Strength 5/5 in right hand and fingers; movement of wrist elicits pain at base of the thumb; cap refill present; no neuro deficits. Full ROM in all extremities and spine. No paraspinal tenderness.   Neurological: He is alert.  No sensory deficits.  Skin: Skin is warm and dry. He is not diaphoretic.  Psychiatric: He has a normal mood and affect. His behavior is normal.  Nursing note and vitals reviewed.  ED Course  Procedures  DIAGNOSTIC STUDIES: Oxygen Saturation is 98% on RA, normal by my interpretation.    COORDINATION OF CARE: 9:37 AM Pt presents today due to associated right thumb pain from a fall. Discussed next steps including ice, percocet, toradol, DG's of the right hand and wrist and reevaluation based on results of the imaging.   Labs Review Labs Reviewed - No data to display  Imaging  Review Dg Wrist Complete Right  05/07/2015  CLINICAL DATA:  Status post fall with right wrist pain EXAM: RIGHT WRIST - COMPLETE 3+ VIEW COMPARISON:  None. FINDINGS: There is no evidence of fracture or dislocation. There is no evidence of arthropathy or other focal bone abnormality. Soft tissues are unremarkable. IMPRESSION: Negative. Electronically Signed   By: Sherian ReinWei-Chen  Lin M.D.   On: 05/07/2015 10:14   Dg Hand Complete Right  05/07/2015  CLINICAL DATA:  29 year old male with right hand and wrist pain after falling down the stairs EXAM: RIGHT HAND - COMPLETE 3+ VIEW COMPARISON:  Concurrently obtained radiographs of the right wrist FINDINGS: There is no evidence of fracture or dislocation. There is no evidence of arthropathy or other focal bone abnormality. Soft tissues are unremarkable. IMPRESSION: Negative. Electronically Signed   By: Malachy MoanHeath  McCullough M.D.   On: 05/07/2015 10:20   I have personally reviewed and evaluated these images as part of my medical decision-making.   EKG Interpretation None      MDM   Final diagnoses:  Thumb pain, right  Fall, initial encounter   [name] presents with right thumb pain following a fall.  Patient has isolated trauma to the right thumb and possibly the right wrist. Patient has no neurologic deficits or true functional deficits. Patient X-Ray negative for fracture or dislocation.  Pt advised to follow up with orthopedics outpatient. Patient given brace while in ED, conservative therapy recommended and discussed. Patient will be discharged home & is agreeable with above plan. Return precautions discussed. Pt appears safe for discharge.  I personally performed the services described in this documentation, which was scribed in my presence. The recorded information has been reviewed and is accurate.   Anselm PancoastShawn C Joy, PA-C 05/07/15 1818  Doug SouSam Jacubowitz, MD 05/08/15 [phone]

## 2015-05-07 NOTE — Progress Notes (Signed)
Orthopedic Tech Progress Note Patient Details:  Miguel DibbleDonte C Hallberg 1986-02-13 [id number]  Ortho Devices Type of Ortho Device: Thumb velcro splint Ortho Device/Splint Location: rue Ortho Device/Splint Interventions: Application   Mardene Lessig 05/07/2015, 11:10 AM

## 2015-05-07 NOTE — ED Notes (Signed)
SEE PA assessment 

## 2015-07-01 ENCOUNTER — Encounter (HOSPITAL_COMMUNITY): Payer: Self-pay | Admitting: *Deleted

## 2015-07-01 ENCOUNTER — Emergency Department (HOSPITAL_COMMUNITY)
Admission: EM | Admit: 2015-07-01 | Discharge: 2015-07-01 | Disposition: A | Discharge status: Home / Self Care | Payer: PRIVATE HEALTH INSURANCE | Attending: Emergency Medicine | Admitting: Emergency Medicine

## 2015-07-01 DIAGNOSIS — J069 Acute upper respiratory infection, unspecified: Secondary | ICD-10-CM

## 2015-07-01 DIAGNOSIS — Z791 Long term (current) use of non-steroidal anti-inflammatories (NSAID): Secondary | ICD-10-CM | POA: Insufficient documentation

## 2015-07-01 DIAGNOSIS — Z0289 Encounter for other administrative examinations: Secondary | ICD-10-CM | POA: Insufficient documentation

## 2015-07-01 DIAGNOSIS — Z79899 Other long term (current) drug therapy: Secondary | ICD-10-CM | POA: Insufficient documentation

## 2015-07-01 DIAGNOSIS — R05 Cough: Secondary | ICD-10-CM | POA: Diagnosis present

## 2015-07-01 DIAGNOSIS — F17210 Nicotine dependence, cigarettes, uncomplicated: Secondary | ICD-10-CM | POA: Insufficient documentation

## 2015-07-01 NOTE — ED Notes (Signed)
Patient presents with c/o head cold that started last night.  Called work and was told he needed a work note.

## 2015-07-01 NOTE — Discharge Instructions (Signed)
Upper Respiratory Infection, Adult Most upper respiratory infections (URIs) are a viral infection of the air passages leading to the lungs. A URI affects the nose, throat, and upper air passages. The most common type of URI is nasopharyngitis and is typically referred to as "the common cold." URIs run their course and usually go away on their own. Most of the time, a URI does not require medical attention, but sometimes a bacterial infection in the upper airways can follow a viral infection. This is called a secondary infection. Sinus and middle ear infections are common types of secondary upper respiratory infections. Bacterial pneumonia can also complicate a URI. A URI can worsen asthma and chronic obstructive pulmonary disease (COPD). Sometimes, these complications can require emergency medical care and may be life threatening.  CAUSES Almost all URIs are caused by viruses. A virus is a type of germ and can spread from one person to another.  RISKS FACTORS You may be at risk for a URI if:   You smoke.   You have chronic heart or lung disease.  You have a weakened defense (immune) system.   You are very young or very old.   You have nasal allergies or asthma.  You work in crowded or poorly ventilated areas.  You work in health care facilities or schools. SIGNS AND SYMPTOMS  Symptoms typically develop 2-3 days after you come in contact with a cold virus. Most viral URIs last 7-10 days. However, viral URIs from the influenza virus (flu virus) can last 14-18 days and are typically more severe. Symptoms may include:   Runny or stuffy (congested) nose.   Sneezing.   Cough.   Sore throat.   Headache.   Fatigue.   Fever.   Loss of appetite.   Pain in your forehead, behind your eyes, and over your cheekbones (sinus pain).  Muscle aches.  DIAGNOSIS  Your health care provider may diagnose a URI by:  Physical exam.  Tests to check that your symptoms are not due to  another condition such as:  Strep throat.  Sinusitis.  Pneumonia.  Asthma. TREATMENT  A URI goes away on its own with time. It cannot be cured with medicines, but medicines may be prescribed or recommended to relieve symptoms. Medicines may help:  Reduce your fever.  Reduce your cough.  Relieve nasal congestion. HOME CARE INSTRUCTIONS   Take medicines only as directed by your health care provider.   Gargle warm saltwater or take cough drops to comfort your throat as directed by your health care provider.  Use a warm mist humidifier or inhale steam from a shower to increase air moisture. This may make it easier to breathe.  Drink enough fluid to keep your urine clear or pale yellow.   Eat soups and other clear broths and maintain good nutrition.   Rest as needed.   Return to work when your temperature has returned to normal or as your health care provider advises. You may need to stay home longer to avoid infecting others. You can also use a face mask and careful hand washing to prevent spread of the virus.  Increase the usage of your inhaler if you have asthma.   Do not use any tobacco products, including cigarettes, chewing tobacco, or electronic cigarettes. If you need help quitting, ask your health care provider. PREVENTION  The best way to protect yourself from getting a cold is to practice good hygiene.   Avoid oral or hand contact with people with cold   symptoms.   Wash your hands often if contact occurs.  There is no clear evidence that vitamin C, vitamin E, echinacea, or exercise reduces the chance of developing a cold. However, it is always recommended to get plenty of rest, exercise, and practice good nutrition.  SEEK MEDICAL CARE IF:   You are getting worse rather than better.   Your symptoms are not controlled by medicine.   You have chills.  You have worsening shortness of breath.  You have brown or red mucus.  You have yellow or brown nasal  discharge.  You have pain in your face, especially when you bend forward.  You have a fever.  You have swollen neck glands.  You have pain while swallowing.  You have white areas in the back of your throat. SEEK IMMEDIATE MEDICAL CARE IF:   You have severe or persistent:  Headache.  Ear pain.  Sinus pain.  Chest pain.  You have chronic lung disease and any of the following:  Wheezing.  Prolonged cough.  Coughing up blood.  A change in your usual mucus.  You have a stiff neck.  You have changes in your:  Vision.  Hearing.  Thinking.  Mood. MAKE SURE YOU:   Understand these instructions.  Will watch your condition.  Will get help right away if you are not doing well or get worse.   This information is not intended to replace advice given to you by your health care provider. Make sure you discuss any questions you have with your health care provider.   Document Released: 10/24/2000 Document Revised: 09/14/2014 Document Reviewed: 08/05/2013 Elsevier Interactive Patient Education 2016 [employer]  

## 2015-07-01 NOTE — ED Notes (Signed)
Patient is alert and orientedx4.  Patient was explained discharge instructions and they understood them with no questions.   

## 2015-07-01 NOTE — ED Provider Notes (Signed)
CSN: [id number]     Arrival date & time 07/01/15  2239 History  By signing my name below, I, [facility], attest that this documentation has been prepared under the direction and in the presence of [employer], PA-C. Electronically Signed: Randell Patient, ED Scribe. 07/01/2015. 11:52 PM.   Chief Complaint  Patient presents with  . URI   The history is provided by the patient. No language interpreter was used.   HPI Comments: Terry Maddox is a 30 y.o. male who presents to the Emergency Department complaining of constant, mild, unchanging rhinorrhea onset last night. Patient reports that he walked outside last night after which rhinorrhea presented, followed by congestion and cough today. 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 He is a current smoker but reports that he is attempting to quit. He denies fevers, chest pain, shortness of breath, rash, recent travel, abdominal pain, nausea, and vomiting.  History reviewed. No pertinent past medical history. Past Surgical History  Procedure Laterality Date  . Facial fracture surgery     No family history on file. Social History  Substance Use Topics  . Smoking status: Current Every Day Smoker -- 0.50 packs/day    Types: Cigarettes  . Smokeless tobacco: Never Used  . Alcohol Use: Yes     Comment: Occasionally    Review of Systems A complete 10 system review of systems was obtained and all systems are negative except as noted in the HPI and PMH.    Allergies  Review of patient's allergies indicates no known allergies.  Home Medications   Prior to Admission medications   Medication Sig Start Date End Date Taking? Authorizing Provider  ibuprofen (ADVIL,MOTRIN) 800 MG tablet Take 1 tablet (800 mg total) by mouth 3 (three) times daily. 05/07/15   Shawn C Joy, PA-C  oxyCODONE-acetaminophen (PERCOCET/ROXICET) 5-325 MG tablet Take 1-2 tablets by mouth every 4 (four) hours as  needed for severe pain. 05/07/15   Shawn C Joy, PA-C  valACYclovir (VALTREX) 1000 MG tablet Take 1 tablet (1,000 mg total) by mouth daily. 03/29/13   Carmelina Dane, MD   BP 114/80 mmHg  Pulse 86  Temp(Src) 97.9 F (36.6 C) (Oral)  Resp 18  Ht  (1.803 m)  Wt 63.504 kg  BMI 19.53 kg/m2  SpO2 96%   Physical Exam  Constitutional: He is oriented to person, place, and time. He appears well-developed and well-nourished. No distress.  HENT:  Head: Normocephalic and atraumatic.  Eyes: Conjunctivae and EOM are normal.  Neck: Neck supple. No tracheal deviation present.  Cardiovascular: Normal rate, regular rhythm, normal heart sounds and intact distal pulses.  Exam reveals no gallop and no friction rub.   No murmur heard. Pulmonary/Chest: Effort normal. No respiratory distress. He has no wheezes. He has no rales. He exhibits no tenderness.  Musculoskeletal: Normal range of motion. He exhibits no edema or tenderness.  Neurological: He is alert and oriented to person, place, and time.  Skin: Skin is warm and dry.  Psychiatric: He has a normal mood and affect. His behavior is normal.  Nursing note and vitals reviewed.   ED Course  Procedures   DIAGNOSTIC STUDIES: Oxygen Saturation is 96% on RA, adequate by my interpretation.    COORDINATION OF CARE: 11:12 PM Discussed treatment plan with pt at bedside and pt agreed to plan.   Labs Review Labs Reviewed - No data to display  Imaging Review No results  found. I have personally reviewed and evaluated these images and lab results as part of my medical decision-making.   EKG Interpretation None      MDM   Final diagnoses:  URI (upper respiratory infection)    Labs:  Imaging:  Consults:  Therapeutics:  Discharge Meds:   Assessment/Plan: Patient presents with likely viral URI. He is afebrile, nontoxic in no acute distress. Patient requesting work note. Patient given work note, contractions for symptomatic care,  she'll return precautions, verbalized understanding and agreement today's plan and had no further questions or concerns at the time discharge   I personally performed the services described in this documentation, which was scribed in my presence. The recorded information has been reviewed and is accurate.    Eyvonne Mechanic, PA-C 07/01/15 2352  Raeford Razor, MD 07/02/15 [phone]

## 2015-09-23 ENCOUNTER — Emergency Department (HOSPITAL_COMMUNITY)
Admission: EM | Admit: 2015-09-23 | Discharge: 2015-09-24 | Disposition: A | Discharge status: Home / Self Care | Payer: Commercial Managed Care - PPO | Attending: Emergency Medicine | Admitting: Emergency Medicine

## 2015-09-23 DIAGNOSIS — M549 Dorsalgia, unspecified: Secondary | ICD-10-CM

## 2015-09-23 DIAGNOSIS — Z791 Long term (current) use of non-steroidal anti-inflammatories (NSAID): Secondary | ICD-10-CM | POA: Diagnosis not present

## 2015-09-23 DIAGNOSIS — F17210 Nicotine dependence, cigarettes, uncomplicated: Secondary | ICD-10-CM | POA: Diagnosis not present

## 2015-09-23 DIAGNOSIS — Z79899 Other long term (current) drug therapy: Secondary | ICD-10-CM | POA: Diagnosis not present

## 2015-09-23 DIAGNOSIS — M546 Pain in thoracic spine: Secondary | ICD-10-CM | POA: Insufficient documentation

## 2015-09-24 ENCOUNTER — Encounter (HOSPITAL_COMMUNITY): Payer: Self-pay | Admitting: *Deleted

## 2015-09-24 MED ORDER — METHOCARBAMOL 500 MG PO TABS: 500.0000 mg | ORAL_TABLET | Freq: Two times a day (BID) | ORAL | Status: DC

## 2015-09-24 NOTE — ED Provider Notes (Signed)
CSN: [id number]     Arrival date & time 09/23/15  2334 History   First MD Initiated Contact with Patient 09/24/15 0009     Chief Complaint  Patient presents with  . Back Pain     (Consider location/radiation/quality/duration/timing/severity/associated sxs/prior Treatment) Patient is a 30 y.o. male presenting with back pain. The history is provided by the patient and medical records.  Back Pain    30 y.o. M here with back pain.  He states he was working out at [employer] Last night and thinks he "overdid it". He states he was very sore upon waking this morning so he called out of work. He states he did take some Motrin earlier which helped with the pain, however he continues to feel very sore. He denies any numbness or weakness of his lower extremities. No bowel or bladder incontinence. Patient states he came here for a work note.  History reviewed. No pertinent past medical history. Past Surgical History  Procedure Laterality Date  . Facial fracture surgery     No family history on file. Social History  Substance Use Topics  . Smoking status: Current Every Day Smoker -- 0.50 packs/day    Types: Cigarettes  . Smokeless tobacco: Never Used  . Alcohol Use: Yes     Comment: Occasionally    Review of Systems  Musculoskeletal: Positive for back pain.  All other systems reviewed and are negative.     Allergies  Review of patient's allergies indicates no known allergies.  Home Medications   Prior to Admission medications   Medication Sig Start Date End Date Taking? Authorizing Provider  ibuprofen (ADVIL,MOTRIN) 800 MG tablet Take 1 tablet (800 mg total) by mouth 3 (three) times daily. 05/07/15   Shawn C Joy, PA-C  oxyCODONE-acetaminophen (PERCOCET/ROXICET) 5-325 MG tablet Take 1-2 tablets by mouth every 4 (four) hours as needed for severe pain. 05/07/15   Shawn C Joy, PA-C  valACYclovir (VALTREX) 1000 MG tablet Take 1 tablet (1,000 mg total) by mouth daily. 03/29/13   Carmelina Dane, MD   BP 109/57 mmHg  Pulse 65  Temp(Src) 98.1 F (36.7 C) (Oral)  Resp 16  SpO2 100% Physical Exam  Constitutional: He is oriented to person, place, and time. He appears well-developed and well-nourished.  HENT:  Head: Normocephalic and atraumatic.  Mouth/Throat: Oropharynx is clear and moist.  Eyes: Conjunctivae and EOM are normal. Pupils are equal, round, and reactive to light.  Neck: Normal range of motion.  Cardiovascular: Normal rate, regular rhythm and normal heart sounds.   Pulmonary/Chest: Effort normal and breath sounds normal.  Abdominal: Soft. Bowel sounds are normal.  Musculoskeletal: Normal range of motion.  TTP noted to thoracic paraspinal muscles bilaterally, there is spasm present, no midline tenderness, step-off, or deformity; full ROM maintained; normal strength and sensation of bilateral lower extremities, normal gait  Neurological: He is alert and oriented to person, place, and time.  Skin: Skin is warm and dry.  Psychiatric: He has a normal mood and affect.  Nursing note and vitals reviewed.   ED Course  Procedures (including critical care time) Labs Review Labs Reviewed - No data to display  Imaging Review No results found. I have personally reviewed and evaluated these images and lab results as part of my medical decision-making.   EKG Interpretation None      MDM   Final diagnoses:  Back pain, unspecified location   30 year old male here with thoracic paraspinal pain after working out again yesterday. Has  no midline tenderness, step-off, or deformity noted on exam. He maintains full range of motion of all spinal levels. He has no current red flag symptoms or neurologic deficits to suggest cauda equina or other emergent spinal pathology.  Doubt fracture/subluxation without trauma.  Suspect muscular strain. Discharge home with robaxin, work-note given.  Discussed plan with patient, he/she acknowledged understanding and agreed with plan of  care.  Return precautions given for new or worsening symptoms.  Garlon HatchetLisa M Ganon Demasi, PA-C 09/24/15 0033  Lyndal Pulleyaniel Knott, MD 09/24/15 [phone]

## 2015-09-24 NOTE — Discharge Instructions (Signed)
Take the prescribed medication as directed. May continue taking tylenol or motrin with this. Return to the ED for new or worsening symptoms.

## 2015-09-24 NOTE — ED Notes (Signed)
The pt is c/o lower back pain. He was working out in the gym last pm and had back pain this am .  When he could not get out of bed this am  He stayed out of work and now he needs a note

## 2015-11-28 ENCOUNTER — Encounter (HOSPITAL_COMMUNITY): Payer: Self-pay | Admitting: Emergency Medicine

## 2015-11-28 DIAGNOSIS — Z79899 Other long term (current) drug therapy: Secondary | ICD-10-CM | POA: Diagnosis not present

## 2015-11-28 DIAGNOSIS — R51 Headache: Secondary | ICD-10-CM | POA: Diagnosis present

## 2015-11-28 DIAGNOSIS — G4489 Other headache syndrome: Secondary | ICD-10-CM | POA: Insufficient documentation

## 2015-11-28 DIAGNOSIS — F17210 Nicotine dependence, cigarettes, uncomplicated: Secondary | ICD-10-CM | POA: Insufficient documentation

## 2015-11-28 NOTE — ED Notes (Signed)
Pt. reports headache onset last night , denies head injury , no fever or chills , denies nausea or vomiting .

## 2015-11-29 ENCOUNTER — Emergency Department (HOSPITAL_COMMUNITY)
Admission: EM | Admit: 2015-11-29 | Discharge: 2015-11-29 | Disposition: A | Discharge status: Home / Self Care | Payer: Commercial Managed Care - PPO | Attending: Emergency Medicine | Admitting: Emergency Medicine

## 2015-11-29 DIAGNOSIS — G4489 Other headache syndrome: Secondary | ICD-10-CM

## 2015-11-29 MED ORDER — IBUPROFEN 600 MG PO TABS: 600.0000 mg | ORAL_TABLET | Freq: Three times a day (TID) | ORAL | Status: DC | PRN

## 2015-11-29 NOTE — Discharge Instructions (Signed)

## 2015-11-29 NOTE — ED Provider Notes (Signed)
CSN: [id number]     Arrival date & time 11/28/15  2215 History  By signing my name below, I, Rosario Adie, attest that this documentation has been prepared under the direction and in the presence of Zadie Rhine, MD. Electronically Signed: Rosario Adie, ED Scribe. 11/29/2015. 2:44 AM.   Chief Complaint  Patient presents with  . Headache   Patient is a 30 y.o. male presenting with headaches. The history is provided by the patient. No language interpreter was used.  Headache Pain location:  Generalized Quality:  Sharp Radiates to:  Does not radiate Onset quality:  Gradual Duration:  2 days Timing:  Intermittent Progression:  Unchanged Chronicity:  New Similar to prior headaches: no   Relieved by:  Nothing Worsened by:  Nothing Ineffective treatments:  NSAIDs Associated symptoms: no abdominal pain, no fever, no nausea, no vomiting and no weakness    HPI Comments: Terry Maddox is a 30 y.o. male with no pertinent PMHx who presents to the Emergency Department complaining of gradual onset, unchanged, intermittent, sharp headaches x 2 days. He denies having a headache while in the ED. He notes that he was eating salty and greasy foods during the onset of his HA. He has associated light headedness secondary to his headaches. He also notes that he has had blurry vision upon waking up from rest, and states that always resolves. Pt also reports that his vision will blur with the throbbing of his headaches. Pt has taken  Ibuprofen every 2-4 hours with his headaches with no relief of his pain. He has been waking up with the headaches after periods of rest. 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 No syncope or head injury/trauma. No recent travel out of the county. No tick exposure. No hx of similar persistent HA. No FHx of strokes. Denies fever, vomiting, nausea, weakness, CP, or abdominal pain.  PMH - none Past Surgical History  Procedure Laterality Date  .  Facial fracture surgery     No family history on file. Social History  Substance Use Topics  . Smoking status: Current Every Day Smoker -- 0.00 packs/day    Types: Cigarettes  . Smokeless tobacco: Never Used  . Alcohol Use: Yes    Review of Systems  Constitutional: Negative for fever.  Cardiovascular: Negative for chest pain.  Gastrointestinal: Negative for nausea, vomiting and abdominal pain.  Neurological: Positive for light-headedness and headaches. Negative for weakness.  All other systems reviewed and are negative.   Allergies  Review of patient's allergies indicates no known allergies.  Home Medications   Prior to Admission medications   Medication Sig Start Date End Date Taking? Authorizing Provider  ibuprofen (ADVIL,MOTRIN) 800 MG tablet Take 1 tablet (800 mg total) by mouth 3 (three) times daily. 05/07/15   Shawn C Joy, PA-C  methocarbamol (ROBAXIN) 500 MG tablet Take 1 tablet (500 mg total) by mouth 2 (two) times daily. 09/24/15   Garlon Hatchet, PA-C  oxyCODONE-acetaminophen (PERCOCET/ROXICET) 5-325 MG tablet Take 1-2 tablets by mouth every 4 (four) hours as needed for severe pain. 05/07/15   Shawn C Joy, PA-C  valACYclovir (VALTREX) 1000 MG tablet Take 1 tablet (1,000 mg total) by mouth daily. 03/29/13   Carmelina Dane, MD   BP 103/60 mmHg  Pulse 47  Temp(Src) 98 F (36.7 C) (Oral)  Resp 16  Ht  (1.803 m)  Wt 155 lb (70.308 kg)  BMI 21.63 kg/m2  SpO2 100%   Physical  Exam CONSTITUTIONAL: Well developed/well nourished HEAD: Normocephalic/atraumatic EYES: EOMI/PERRL, no nystagmus, no ptosis ENMT: Mucous membranes moist NECK: supple no meningeal signs, no bruits SPINE/BACK:entire spine nontender CV: S1/S2 noted, no murmurs/rubs/gallops noted LUNGS: Lungs are clear to auscultation bilaterally, no apparent distress ABDOMEN: soft, nontender, no rebound or guarding GU:no cva tenderness NEURO:Awake/alert, face symmetric, no arm or leg drift is  noted Equal 5/5 strength with shoulder abduction, elbow flex/extension, wrist flex/extension in upper extremities and equal hand grips bilaterally Equal 5/5 strength with hip flexion,knee flex/extension, foot dorsi/plantar flexion Cranial nerves 3/4/5/6/11/19/08/11/12 tested and intact Gait normal without ataxia No past pointing Sensation to light touch intact in all extremities EXTREMITIES: pulses normal, full ROM SKIN: warm, color normal PSYCH: no abnormalities of mood noted, alert and oriented to situation  ED Course  Procedures   DIAGNOSTIC STUDIES: Oxygen Saturation is 100% on RA, normal by my interpretation.   COORDINATION OF CARE: 2:42 AM-Discussed next steps with pt. Pt verbalized understanding and is agreeable with the plan.  HA resolved He is well appearing Discussed return precautions Advised to increase PO fluids  MDM   Final diagnoses:  Other headache syndrome    Nursing notes including past medical history and social history reviewed and considered in documentation   I personally performed the services described in this documentation, which was scribed in my presence. The recorded information has been reviewed and is accurate.        Zadie Rhineonald Skila Rollins, MD 11/29/15 [phone]

## 2015-11-29 NOTE — ED Notes (Signed)
MD at bedside. 

## 2015-11-29 NOTE — ED Notes (Signed)
Pt left at this time with all belongings.  

## 2016-03-28 ENCOUNTER — Other Ambulatory Visit: Payer: Self-pay | Admitting: Occupational Medicine

## 2016-03-28 ENCOUNTER — Ambulatory Visit: Payer: Self-pay

## 2016-03-28 DIAGNOSIS — Z0000 Encounter for general adult medical examination without abnormal findings: Secondary | ICD-10-CM

## 2016-10-04 ENCOUNTER — Ambulatory Visit (HOSPITAL_COMMUNITY)
Admission: EM | Admit: 2016-10-04 | Discharge: 2016-10-04 | Disposition: A | Discharge status: Home / Self Care | Payer: PRIVATE HEALTH INSURANCE | Attending: Family Medicine | Admitting: Family Medicine

## 2016-10-04 ENCOUNTER — Encounter (HOSPITAL_COMMUNITY): Payer: Self-pay | Admitting: Family Medicine

## 2016-10-04 DIAGNOSIS — L729 Follicular cyst of the skin and subcutaneous tissue, unspecified: Secondary | ICD-10-CM

## 2016-10-04 NOTE — ED Provider Notes (Signed)
CSN: [id number]     Arrival date & time 10/04/16  1547 History   First MD Initiated Contact with Patient 10/04/16 1617     Chief Complaint  Patient presents with  . Cyst   (Consider location/radiation/quality/duration/timing/severity/associated sxs/prior Treatment) 31 year old male presents to clinic with a chief complaint of a cyst on his face for one year. He states he is not in pain, but he is "tired of people looking at it" He has no significant past history, no pertinent family or social history.     The history is provided by the patient.    History reviewed. No pertinent past medical history. Past Surgical History:  Procedure Laterality Date  . FACIAL FRACTURE SURGERY     History reviewed. No pertinent family history. Social History  Substance Use Topics  . Smoking status: Current Every Day Smoker    Packs/day: 0.00    Types: Cigarettes  . Smokeless tobacco: Never Used  . Alcohol use Yes    Review of Systems  Constitutional: Negative.   HENT: Negative.   Respiratory: Negative.   Cardiovascular: Negative.   Gastrointestinal: Negative.   Musculoskeletal: Negative.   Skin: Negative for color change, pallor and wound.       sebaceous cyst of the face  Neurological: Negative.     Allergies  Patient has no known allergies.  Home Medications   Prior to Admission medications   Medication Sig Start Date End Date Taking? Authorizing Provider  ibuprofen (ADVIL,MOTRIN) 600 MG tablet Take 1 tablet (600 mg total) by mouth every 8 (eight) hours as needed. 11/29/15   Zadie Rhine, MD   Meds Ordered and Administered this Visit  Medications - No data to display  BP 122/82   Pulse 79   Temp 98.5 F (36.9 C)   Resp 18   SpO2 99%  No data found.   Physical Exam  Constitutional: He is oriented to person, place, and time. He appears well-developed and well-nourished. No distress.  HENT:  Head: Normocephalic.    Right Ear: External ear normal.  Left Ear:  External ear normal.  Eyes: Conjunctivae are normal.  Neck: Normal range of motion.  Cardiovascular: Normal rate and regular rhythm.   Pulmonary/Chest: Effort normal.  Lymphadenopathy:    He has no cervical adenopathy.  Neurological: He is alert and oriented to person, place, and time.  Skin: Skin is warm and dry. Capillary refill takes less than 2 seconds. He is not diaphoretic.  Psychiatric: He has a normal mood and affect. His behavior is normal.  Nursing note and vitals reviewed.   Urgent Care Course     .Marland KitchenIncision and Drainage Date/Time: 10/04/2016 4:44 PM Performed by: Dorena Bodo Authorized by: Elvina Sidle   Consent:    Consent obtained:  Verbal   Consent given by:  Patient   Risks discussed:  Bleeding, incomplete drainage, infection and pain   Alternatives discussed:  No treatment, alternative treatment and referral Location:    Type:  Cyst   Size:  2 cm   Location:  Head   Head location:  Face Pre-procedure details:    Skin preparation:  Betadine Anesthesia (see MAR for exact dosages):    Anesthesia method:  Local infiltration   Local anesthetic:  Lidocaine 2% WITH epi Procedure type:    Complexity:  Simple Procedure details:    Needle aspiration: no     Incision types:  Single straight   Incision depth:  Subcutaneous   Scalpel blade:  11  Wound management:  Probed and deloculated and debrided   Drainage:  Purulent   Drainage amount:  Copious   Wound treatment:  Wound left open   Packing materials:  None Post-procedure details:    Patient tolerance of procedure:  Tolerated well, no immediate complications   (including critical care time)  Labs Review Labs Reviewed - No data to display  Imaging Review No results found.      MDM   1. Benign cyst of skin    Risks and benefits of the procedure discussed with patient, patient was advised for best cosmetic outcome, referral to dermatology or plastics would be recommended. Pt verbalized  understanding and consented to the procedure. Area was cleaned with betadine, local infiltration with lidocaine and epinephrine, single straight incision made, copious purulent material drained, sack removed, antibiotic ointment and bandage applied, follow up guidelines provided. Return to clinic as needed.    Dorena BodoKennard, Saafir Abdullah, NP 10/04/16 1651

## 2016-10-04 NOTE — ED Triage Notes (Signed)
Pt here for cyst to left temple area x 1 year. No pain

## 2016-10-04 NOTE — Discharge Instructions (Signed)
Keep your wound clean and dry, change bandage at least daily, apply over the counter antibiotic ointment daily. If you have any signs or symptoms of infection, return to clinic.

## 2016-11-22 ENCOUNTER — Other Ambulatory Visit: Payer: Self-pay | Admitting: Occupational Medicine

## 2016-11-22 ENCOUNTER — Ambulatory Visit: Payer: Self-pay

## 2016-11-22 DIAGNOSIS — Z0000 Encounter for general adult medical examination without abnormal findings: Secondary | ICD-10-CM

## 2017-07-20 IMAGING — DX DG HAND COMPLETE 3+V*R*
3 series · 3 of 3 positions shown · non-contrast
Comparison: Concurrently obtained radiographs of the right wrist

CLINICAL DATA: 29-year-old male with right hand and wrist pain
after falling down the stairs

EXAM:
RIGHT HAND - COMPLETE 3+ VIEW

[hand pa]
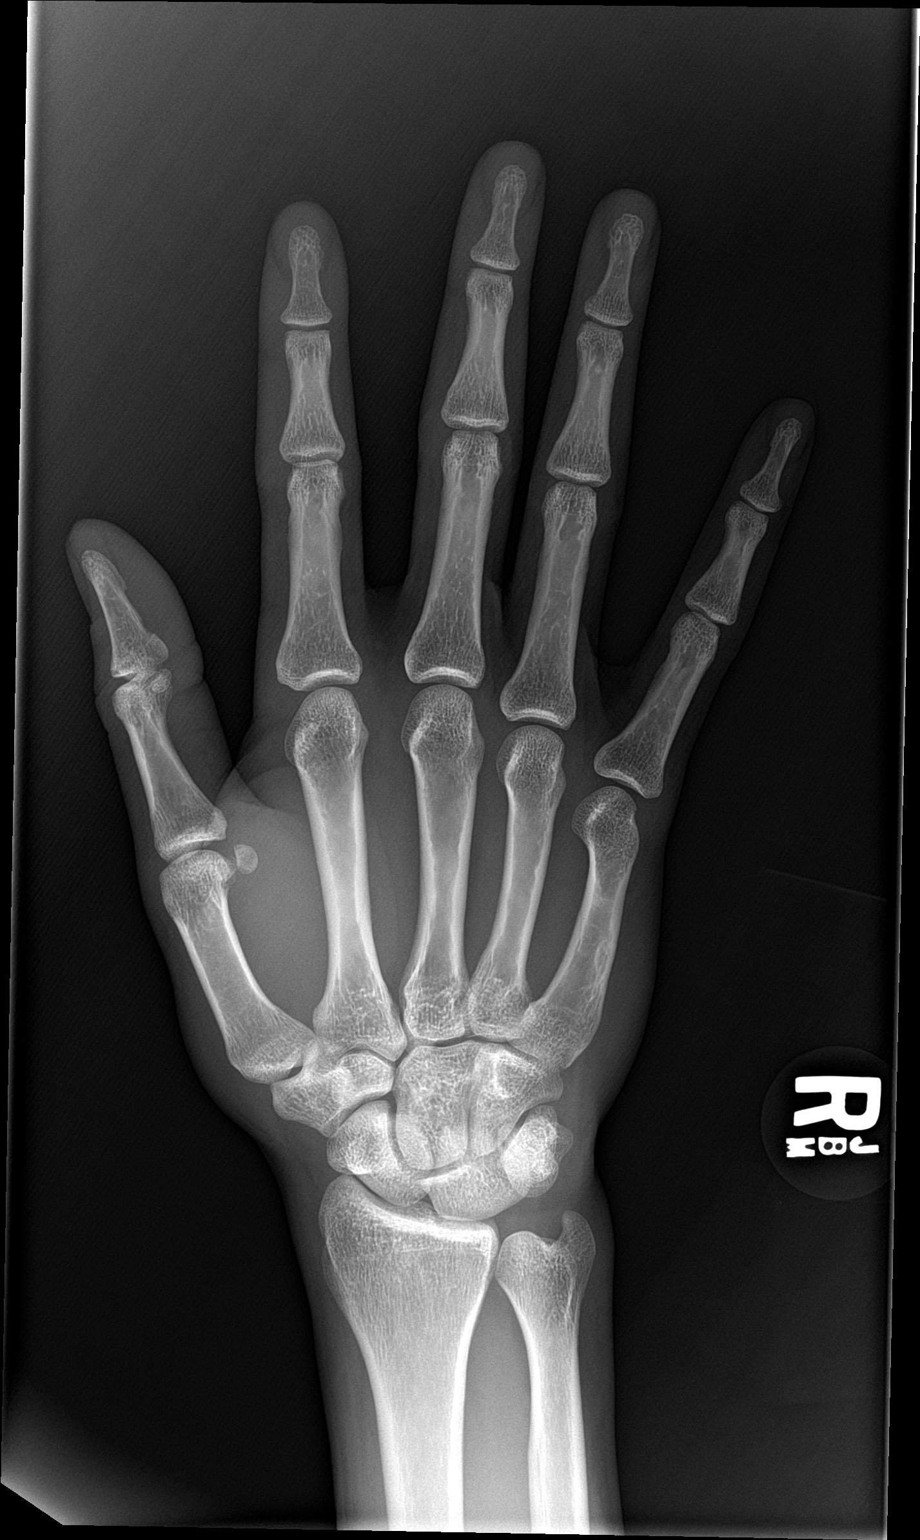

[hand obl]
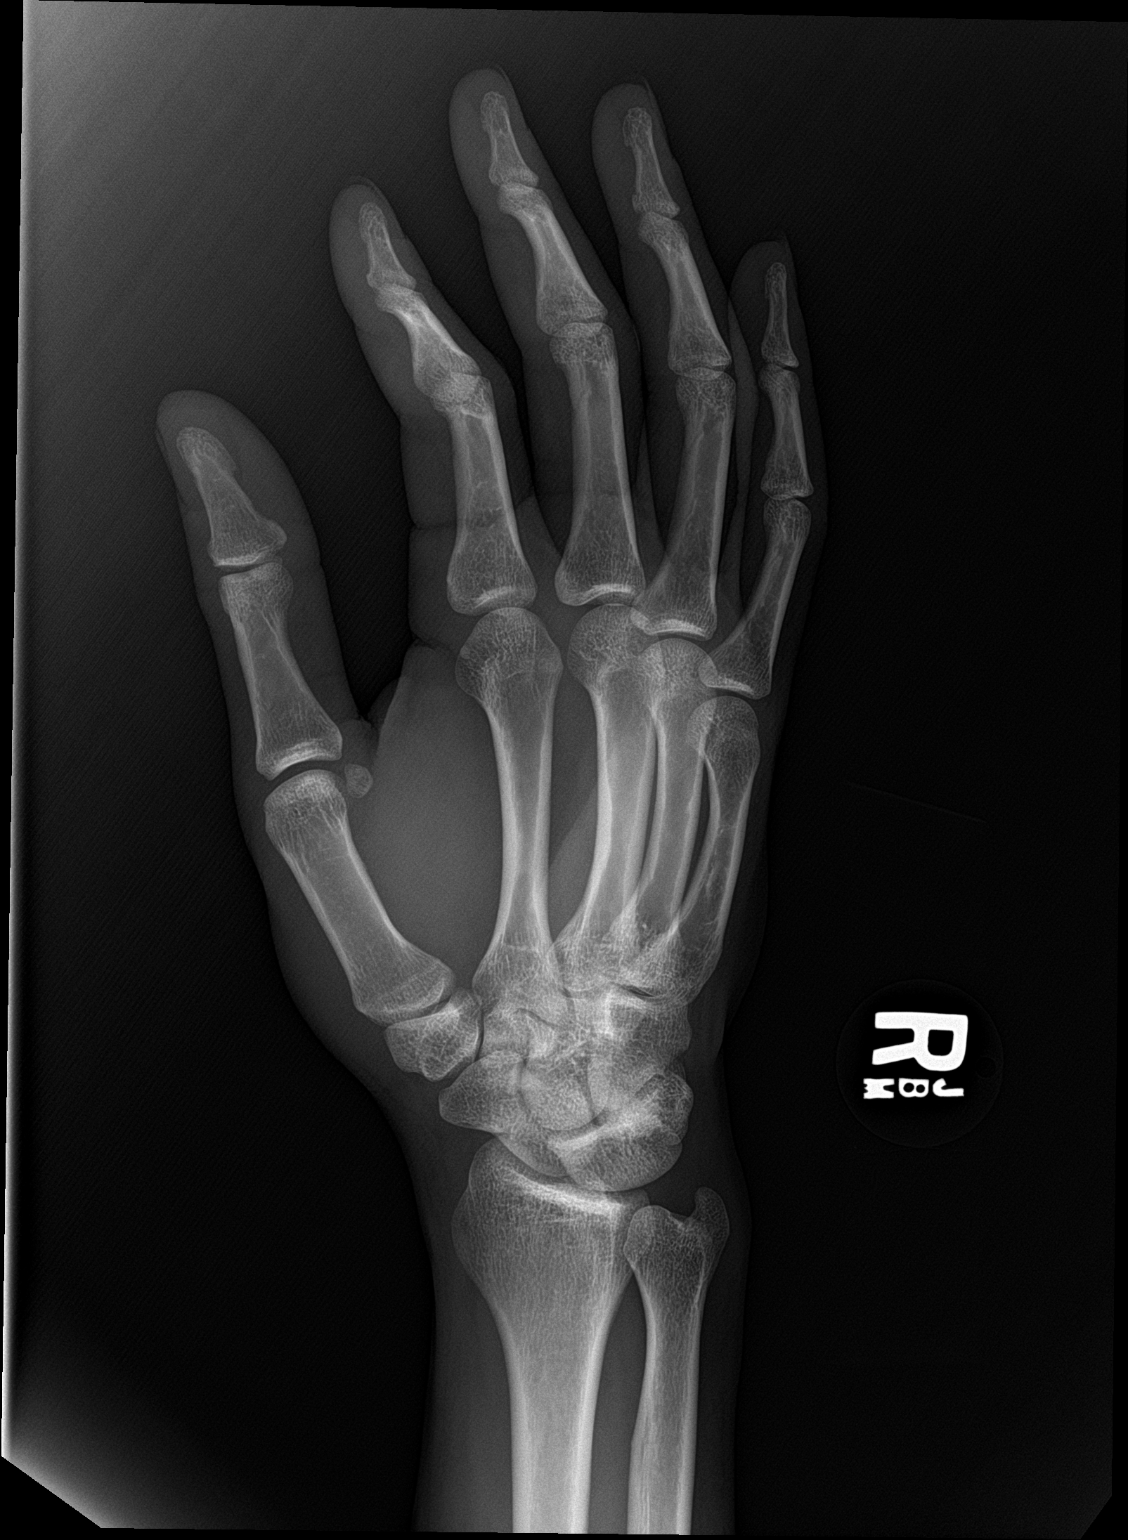

[hand lat]
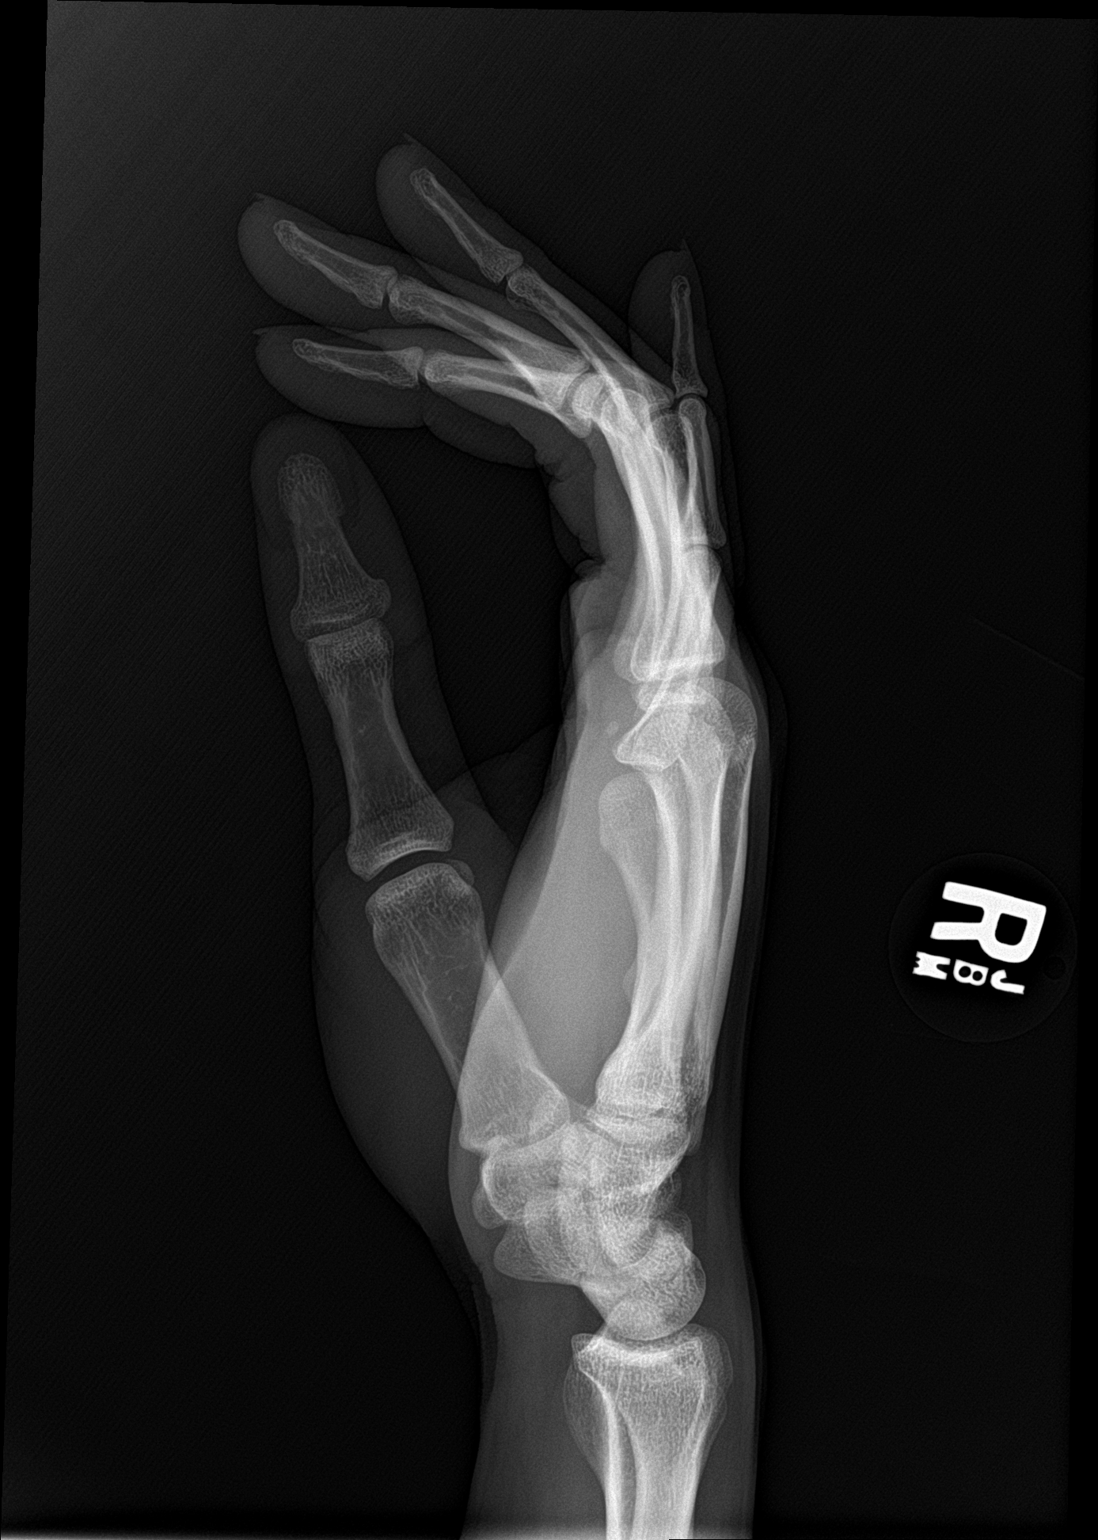

[3 of 3 positions shown; findings below may reference images not displayed]

FINDINGS: There is no evidence of fracture or dislocation. There is no
evidence of arthropathy or other focal bone abnormality. Soft
tissues are unremarkable.
IMPRESSION: Negative.

## 2018-11-24 ENCOUNTER — Other Ambulatory Visit: Payer: Self-pay

## 2018-11-24 ENCOUNTER — Ambulatory Visit (HOSPITAL_COMMUNITY)
Admission: EM | Admit: 2018-11-24 | Discharge: 2018-11-24 | Disposition: A | Discharge status: Home / Self Care | Payer: PRIVATE HEALTH INSURANCE | Attending: Emergency Medicine | Admitting: Emergency Medicine

## 2018-11-24 ENCOUNTER — Encounter (HOSPITAL_COMMUNITY): Payer: Self-pay

## 2018-11-24 DIAGNOSIS — L0291 Cutaneous abscess, unspecified: Secondary | ICD-10-CM

## 2018-11-24 MED ORDER — DOXYCYCLINE HYCLATE 100 MG PO CAPS: 100.0000 mg | ORAL_CAPSULE | Freq: Two times a day (BID) | ORAL | 0 refills | Status: AC

## 2018-11-24 NOTE — ED Triage Notes (Signed)
Pt presents with abscess on left groin area X 2 days. 

## 2018-11-24 NOTE — ED Provider Notes (Signed)
[facility]    CSN: [id number] Arrival date & time: 11/24/18  1312     History   Chief Complaint Chief Complaint  Patient presents with  . Abscess    HPI Terry Maddox is a 33 y.o. male.   He presents with abscess in his left groin x4 days; no drainage.  He states he has had this happen before approximately a year ago but it resolved spontaneously.  He denies fever or chills.   The history is provided by the patient.    History reviewed. No pertinent past medical history.  There are no active problems to display for this patient.   Past Surgical History:  Procedure Laterality Date  . FACIAL FRACTURE SURGERY         Home Medications    Prior to Admission medications   Medication Sig Start Date End Date Taking? Authorizing Provider  doxycycline (VIBRAMYCIN) 100 MG capsule Take 1 capsule (100 mg total) by mouth 2 (two) times daily. 11/24/18   Mickie Bailate, Jabbar Palmero H, NP  ibuprofen (ADVIL,MOTRIN) 600 MG tablet Take 1 tablet (600 mg total) by mouth every 8 (eight) hours as needed. 11/29/15   Zadie RhineWickline, Donald, MD    Family History History reviewed. No pertinent family history.  Social History Social History   Tobacco Use  . Smoking status: Current Every Day Smoker    Packs/day: 0.00    Types: Cigarettes  . Smokeless tobacco: Never Used  Substance Use Topics  . Alcohol use: Yes  . Drug use: No     Allergies   Patient has no known allergies.   Review of Systems Review of Systems  Constitutional: Negative for chills and fever.  HENT: Negative for ear pain and sore throat.   Eyes: Negative for pain and visual disturbance.  Respiratory: Negative for cough and shortness of breath.   Cardiovascular: Negative for chest pain and palpitations.  Gastrointestinal: Negative for abdominal pain and vomiting.  Genitourinary: Negative for dysuria and hematuria.  Musculoskeletal: Negative for arthralgias and back pain.  Skin: Positive for wound. Negative for color  change and rash.  Neurological: Negative for seizures and syncope.  All other systems reviewed and are negative.    Physical Exam Triage Vital Signs ED Triage Vitals  Enc Vitals Group     BP 11/24/18 1438 123/76     Pulse Rate 11/24/18 1438 75     Resp 11/24/18 1438 18     Temp 11/24/18 1438 98.8 F (37.1 C)     Temp Source 11/24/18 1438 Oral     SpO2 11/24/18 1438 99 %     Weight --      Height --      Head Circumference --      Peak Flow --      Pain Score 11/24/18 1439 9     Pain Loc --      Pain Edu? --      Excl. in GC? --    No data found.  Updated Vital Signs BP 123/76 (BP Location: Right Arm)   Pulse 75   Temp 98.8 F (37.1 C) (Oral)   Resp 18   SpO2 99%   Visual Acuity Right Eye Distance:   Left Eye Distance:   Bilateral Distance:    Right Eye Near:   Left Eye Near:    Bilateral Near:     Physical Exam Vitals signs and nursing note reviewed.  Constitutional:      Appearance: He is well-developed.  HENT:     Head: Normocephalic and atraumatic.  Eyes:     Conjunctiva/sclera: Conjunctivae normal.  Neck:     Musculoskeletal: Neck supple.  Cardiovascular:     Rate and Rhythm: Normal rate and regular rhythm.     Heart sounds: No murmur.  Pulmonary:     Effort: Pulmonary effort is normal. No respiratory distress.     Breath sounds: Normal breath sounds.  Abdominal:     Palpations: Abdomen is soft.     Tenderness: There is no abdominal tenderness.  Skin:    General: Skin is warm and dry.     Comments: Left inguinal/perineal area with 3 cm tender, fluctuant abscess. No drainage or streaks.   Neurological:     Mental Status: He is alert.      UC Treatments / Results  Labs (all labs ordered are listed, but only abnormal results are displayed) Labs Reviewed - No data to display  EKG   Radiology No results found.  Procedures Incision and Drainage  Date/Time: 11/24/2018 3:01 PM Performed by: Sharion Balloon, NP Authorized by: Sharion Balloon, NP   Consent:    Consent obtained:  Verbal   Consent given by:  Patient Location:    Type:  Abscess   Size:  3 cm Pre-procedure details:    Skin preparation:  Betadine Anesthesia (see MAR for exact dosages):    Anesthesia method:  Local infiltration   Local anesthetic:  Lidocaine 1% w/o epi Procedure details:    Scalpel blade:  11   Drainage:  Purulent   Drainage amount:  Moderate   Wound treatment:  Wound left open Post-procedure details:    Patient tolerance of procedure:  Tolerated well, no immediate complications   (including critical care time)  Medications Ordered in UC Medications - No data to display  Initial Impression / Assessment and Plan / UC Course  I have reviewed the triage vital signs and the nursing notes.  Pertinent labs & imaging results that were available during my care of the patient were reviewed by me and considered in my medical decision making (see chart for details).   Abscess.  I&D performed.  Treating today with doxycycline x10 days.  Wound care instructions given to patient.  Discussed that he needs to call this afternoon to make an appointment with [facility] for follow-up in 2-3 days.  Discussed that he should return here or go to the emergency department if he develops a fever, chills, or worsening pain.     Final Clinical Impressions(s) / UC Diagnoses   Final diagnoses:  Abscess     Discharge Instructions     Take the antibiotic doxycycline as prescribed for 10 days.  Allow the abscess to drain.  Keep the area clean and dry.  Follow-up with [facility] in 2 - 3 days.   Return here or go to the emergency department if you develop fever, chills, worsening pain, or other concerning symptoms.    ED Prescriptions    Medication Sig Dispense Auth. Provider   doxycycline (VIBRAMYCIN) 100 MG capsule Take 1 capsule (100 mg total) by mouth 2 (two) times daily. 20 capsule Sharion Balloon, NP     Controlled  Substance Prescriptions [place] Controlled Substance Registry consulted? Not Applicable   Sharion Balloon, NP 11/24/18 1511

## 2018-11-24 NOTE — Discharge Instructions (Signed)
Take the antibiotic doxycycline as prescribed for 10 days.  Allow the abscess to drain.  Keep the area clean and dry.  Follow-up with [facility] in 2 - 3 days.   Return here or go to the emergency department if you develop fever, chills, worsening pain, or other concerning symptoms.

## 2019-02-05 IMAGING — CR DG CHEST 1V
1 series · 1 of 1 positions shown · non-contrast
Comparison: 03/28/2016

CLINICAL DATA: Physical exam, smoker

EXAM:
CHEST 1 VIEW

[view not recorded]
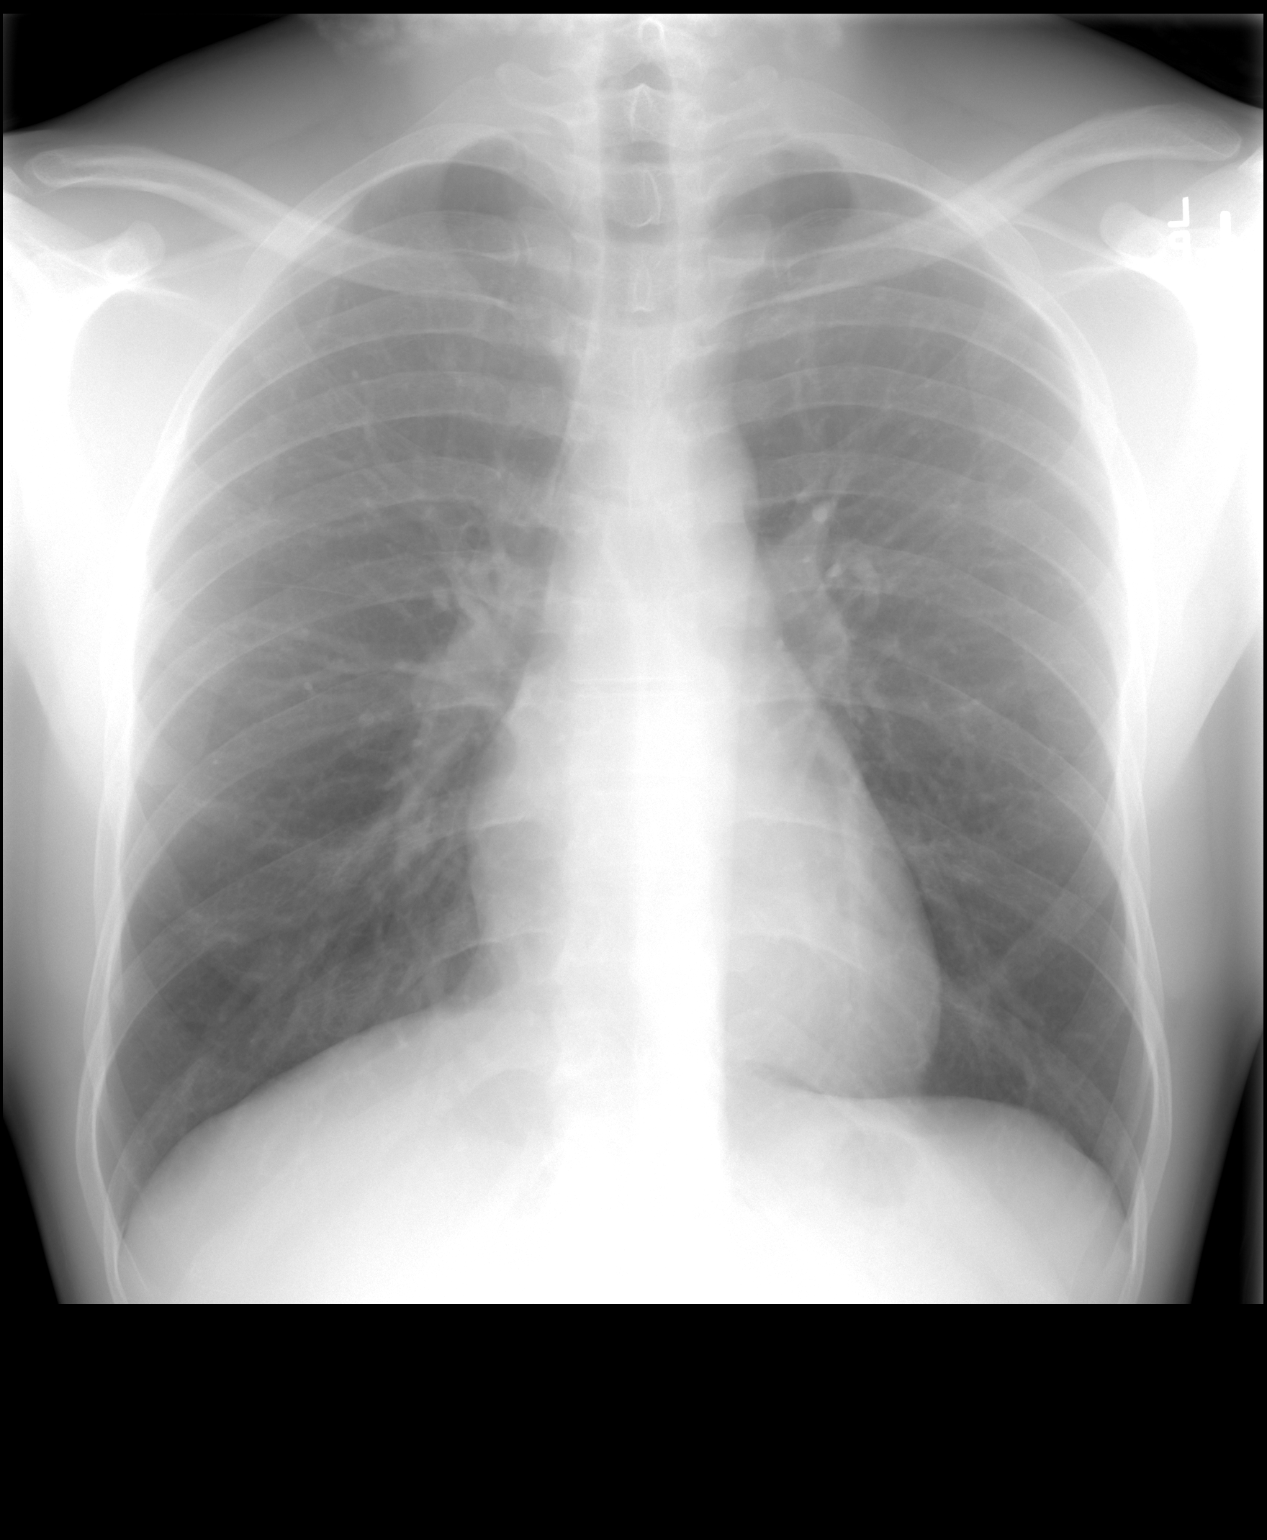

[1 of 1 positions shown; findings below may reference images not displayed]

FINDINGS: Heart and mediastinal contours are within normal limits. No focal
opacities or effusions. No acute bony abnormality.
IMPRESSION: No active disease.

## 2020-12-20 ENCOUNTER — Encounter (HOSPITAL_COMMUNITY): Payer: Self-pay

## 2020-12-20 ENCOUNTER — Other Ambulatory Visit: Payer: Self-pay

## 2020-12-20 ENCOUNTER — Ambulatory Visit (INDEPENDENT_AMBULATORY_CARE_PROVIDER_SITE_OTHER): Payer: PRIVATE HEALTH INSURANCE

## 2020-12-20 ENCOUNTER — Ambulatory Visit (HOSPITAL_COMMUNITY)
Admission: EM | Admit: 2020-12-20 | Discharge: 2020-12-20 | Disposition: A | Discharge status: Home / Self Care | Payer: PRIVATE HEALTH INSURANCE | Attending: Family Medicine | Admitting: Family Medicine

## 2020-12-20 DIAGNOSIS — R079 Chest pain, unspecified: Secondary | ICD-10-CM

## 2020-12-20 DIAGNOSIS — R0602 Shortness of breath: Secondary | ICD-10-CM

## 2020-12-20 MED ORDER — DICLOFENAC SODIUM 75 MG PO TBEC: 75.0000 mg | DELAYED_RELEASE_TABLET | Freq: Two times a day (BID) | ORAL | 0 refills | Status: AC

## 2020-12-20 NOTE — ED Triage Notes (Signed)
Pt states having Covid 2 weeks ago, tested negative recently. Pt c/o right chest pain since having Covid, states with certain movements pain occurs. Pt states at times he has SOB but not currently.  Chest pain started 3 days ago.

## 2020-12-20 NOTE — ED Provider Notes (Signed)
[facility]   [id number] 12/20/20 Arrival Time: 1437  ASSESSMENT & PLAN:  1. Chest pain, unspecified type    I have personally viewed the imaging studies ordered this visit. No acute changes on CXR. No pneumothorax.  Likely MSK pain. Discussed.  Begin trial of: Meds ordered this encounter  Medications   diclofenac (VOLTAREN) 75 MG EC tablet    Sig: Take 1 tablet (75 mg total) by mouth 2 (two) times daily.    Dispense:  14 tablet    Refill:  0   Activities as tolerated.   Follow-up Information     [facility].   Specialty: Urgent Care Why: If worsening or failing to improve as anticipated. Contact information: [address] [place] [address] [phone]                Reviewed expectations re: course of current medical issues. Questions answered. Outlined signs and symptoms indicating need for more acute intervention. Understanding verbalized. After Visit Summary given.   SUBJECTIVE: History from: patient. Terry Maddox is a 35 y.o. male who reports having COVID two weeks ago; persistent coughing. Now with R sided CP. Sharp. Worse with deep inspiration. No SOB reported. Normal PO intake without n/v. Afebrile.  Social History   Tobacco Use  Smoking Status Every Day   Packs/day: 0.00   Types: Cigarettes  Smokeless Tobacco Never    OBJECTIVE:  Vitals:   12/20/20 1504  BP: 116/73  Pulse: 88  Resp: 18  Temp: 98.7 F (37.1 C)  TempSrc: Oral  SpO2: 97%    General appearance: alert; no distress Eyes: PERRLA; EOMI; conjunctiva normal Neck: supple  Lungs: speaks full sentences without difficulty; unlabored; CTAB CV: RRR Extremities: no edema Skin: warm and dry Neurologic: normal gait Psychological: alert and cooperative; normal mood and affect  Imaging: DG Chest 2 View  Result Date: 12/20/2020 CLINICAL DATA:  Chest pain shortness of breath EXAM: CHEST - 2 VIEW COMPARISON:  November 22, 2016.  FINDINGS: The heart size and mediastinal contours are within normal limits. No focal consolidation. No pleural effusion. No pneumothorax. The visualized skeletal structures are unremarkable. IMPRESSION: No active cardiopulmonary disease. Electronically Signed   By: Maudry Mayhew MD   On: 12/20/2020 15:50    No Known Allergies  History reviewed. No pertinent past medical history. Social History   Socioeconomic History   Marital status: Single    Spouse name: Not on file   Number of children: Not on file   Years of education: Not on file   Highest education level: Not on file  Occupational History   Not on file  Tobacco Use   Smoking status: Every Day    Packs/day: 0.00    Types: Cigarettes   Smokeless tobacco: Never  Substance and Sexual Activity   Alcohol use: Yes    Comment: occ   Drug use: Yes    Types: Marijuana    Comment: occ   Sexual activity: Not on file  Other Topics Concern   Not on file  Social History Narrative   Not on file   Social Determinants of Health   Financial Resource Strain: Not on file  Food Insecurity: Not on file  Transportation Needs: Not on file  Physical Activity: Not on file  Stress: Not on file  Social Connections: Not on file  Intimate Partner Violence: Not on file   History reviewed. No pertinent family history. Past Surgical History:  Procedure Laterality Date  FACIAL FRACTURE SURGERY       Mardella Layman, MD 12/20/20 [phone]

## 2020-12-20 NOTE — Discharge Instructions (Signed)
You have been seen at the [facility] today for chest pain. Your evaluation today was not suggestive of any emergent condition requiring medical intervention at this time. Your chest x-ray did not show any worrisome changes. However, some medical problems make take more time to appear. Therefore, it's very important that you pay attention to any new symptoms or worsening of your current condition.  Please proceed directly to the Emergency Department immediately should you feel worse in any way or have any of the following symptoms: increasing or different chest pain, pain that spreads to your arm, neck, jaw, back or abdomen, shortness of breath, or nausea and vomiting.

## 2021-09-18 ENCOUNTER — Encounter (HOSPITAL_COMMUNITY): Payer: Self-pay

## 2021-09-18 ENCOUNTER — Other Ambulatory Visit: Payer: Self-pay

## 2021-09-18 ENCOUNTER — Emergency Department (HOSPITAL_COMMUNITY)
Admission: EM | Admit: 2021-09-18 | Discharge: 2021-09-19 | Discharge status: Against Medical Advice | Payer: PRIVATE HEALTH INSURANCE | Attending: Emergency Medicine | Admitting: Emergency Medicine

## 2021-09-18 DIAGNOSIS — R519 Headache, unspecified: Secondary | ICD-10-CM | POA: Insufficient documentation

## 2021-09-18 DIAGNOSIS — Z5321 Procedure and treatment not carried out due to patient leaving prior to being seen by health care provider: Secondary | ICD-10-CM | POA: Insufficient documentation

## 2021-09-18 NOTE — ED Triage Notes (Signed)
Pt went to sleep with small HA. Was awakened by more intense HA. Pt is sensitive to light and states head is pounding. ?

## 2021-09-19 NOTE — ED Notes (Signed)
Called for room x3 °

## 2023-03-27 ENCOUNTER — Other Ambulatory Visit: Payer: Self-pay

## 2023-03-27 ENCOUNTER — Emergency Department (HOSPITAL_BASED_OUTPATIENT_CLINIC_OR_DEPARTMENT_OTHER)
Admission: EM | Admit: 2023-03-27 | Discharge: 2023-03-27 | Disposition: A | Discharge status: Home / Self Care | Payer: Self-pay | Attending: Emergency Medicine | Admitting: Emergency Medicine

## 2023-03-27 DIAGNOSIS — J40 Bronchitis, not specified as acute or chronic: Secondary | ICD-10-CM | POA: Insufficient documentation

## 2023-03-27 DIAGNOSIS — Z1152 Encounter for screening for COVID-19: Secondary | ICD-10-CM | POA: Insufficient documentation

## 2023-03-27 DIAGNOSIS — J45909 Unspecified asthma, uncomplicated: Secondary | ICD-10-CM | POA: Insufficient documentation

## 2023-03-27 DIAGNOSIS — Z87891 Personal history of nicotine dependence: Secondary | ICD-10-CM | POA: Insufficient documentation

## 2023-03-27 LAB — SARS CORONAVIRUS 2 BY RT PCR: SARS Coronavirus 2 by RT PCR: NEGATIVE

## 2023-03-27 MED ORDER — AMOXICILLIN 500 MG PO CAPS: 1000.0000 mg | ORAL_CAPSULE | Freq: Two times a day (BID) | ORAL | 0 refills | Status: AC

## 2023-03-27 MED ORDER — ALBUTEROL SULFATE HFA 108 (90 BASE) MCG/ACT IN AERS
2.0000 | INHALATION_SPRAY | RESPIRATORY_TRACT | Status: DC | PRN
  Administered 2023-03-27: 2 via RESPIRATORY_TRACT

## 2023-03-27 MED ORDER — AEROCHAMBER PLUS FLO-VU LARGE MISC
1.0000 | Freq: Once | Status: DC
  Filled 2023-03-27: qty 1

## 2023-03-27 MED ORDER — AMOXICILLIN 500 MG PO CAPS
1000.0000 mg | ORAL_CAPSULE | Freq: Once | ORAL | Status: AC
  Administered 2023-03-27: 1000 mg via ORAL
  Filled 2023-03-27: qty 2

## 2023-03-27 MED ORDER — ALBUTEROL SULFATE HFA 108 (90 BASE) MCG/ACT IN AERS
INHALATION_SPRAY | RESPIRATORY_TRACT | Status: AC
  Filled 2023-03-27: qty 6.7

## 2023-03-27 MED ORDER — ALBUTEROL SULFATE HFA 108 (90 BASE) MCG/ACT IN AERS: 2.0000 | INHALATION_SPRAY | Freq: Four times a day (QID) | RESPIRATORY_TRACT | Status: DC

## 2023-03-27 NOTE — Discharge Instructions (Signed)
It would be advised to stop smoking, even if only for duration of this illness.   Take the medication as prescribed. Use the inhaler if this helps with cough. Tylenol and/or ibuprofen for aches and if any fever develops.

## 2023-03-27 NOTE — ED Triage Notes (Signed)
Pt via pov from home with cough, congestion, chills, flu-like symptoms x 1 week. Pt state he has had some dizzy spells, but not today. Pt alert  & oriented, nad noted.

## 2023-03-27 NOTE — ED Provider Notes (Addendum)
[facility] Provider Note   CSN: [id number] Arrival date & time: 03/27/23  1245     History  Chief Complaint  Patient presents with   Cough    Terry Maddox is a 37 y.o. male.  Patient to ED with productive cough x 7 days. He reports symptoms started as a chilled feeling and dry cough and have progressed to persistent cough, aches, generalized weakness and night sweats. No vomiting or diarrhea. He is a smoker, history of childhood asthma.   The history is provided by the patient. No language interpreter was used.  Cough      Home Medications Prior to Admission medications   Medication Sig Start Date End Date Taking? Authorizing Provider  amoxicillin (AMOXIL) 500 MG capsule Take 2 capsules (1,000 mg total) by mouth 2 (two) times daily. 03/27/23  Yes Elpidio Anis, PA-C  diclofenac (VOLTAREN) 75 MG EC tablet Take 1 tablet (75 mg total) by mouth 2 (two) times daily. 12/20/20   Mardella Layman, MD  doxycycline (VIBRAMYCIN) 100 MG capsule Take 1 capsule (100 mg total) by mouth 2 (two) times daily. 11/24/18   Mickie Bail, NP      Allergies    Patient has no known allergies.    Review of Systems   Review of Systems  Respiratory:  Positive for cough.     Physical Exam Updated Vital Signs BP 126/86   Pulse 71   Temp 98.5 F (36.9 C) (Oral)   Resp 18   Ht 5\' 11"  (1.803 m)   Wt 65.8 kg   SpO2 96%   BMI 20.22 kg/m  Physical Exam Vitals and nursing note reviewed.  Constitutional:      Appearance: Normal appearance.  HENT:     Head: Normocephalic.     Nose:     Right Sinus: No maxillary sinus tenderness or frontal sinus tenderness.     Left Sinus: No maxillary sinus tenderness or frontal sinus tenderness.     Mouth/Throat:     Mouth: Mucous membranes are moist.  Cardiovascular:     Rate and Rhythm: Normal rate.  Pulmonary:     Effort: Pulmonary effort is normal.     Comments: Minimal bilateral end-expiratory wheezes.   Abdominal:     General: There is no distension.  Musculoskeletal:        General: Normal range of motion.     Cervical back: Normal range of motion.  Skin:    General: Skin is warm and dry.  Neurological:     Mental Status: He is alert and oriented to person, place, and time.     ED Results / Procedures / Treatments   Labs (all labs ordered are listed, but only abnormal results are displayed) Labs Reviewed  SARS CORONAVIRUS 2 BY RT PCR    EKG None  Radiology No results found.  Procedures Procedures    Medications Ordered in ED Medications  albuterol (VENTOLIN HFA) 108 (90 Base) MCG/ACT inhaler 2 puff (2 puffs Inhalation Given 03/27/23 1425)  AeroChamber Plus Flo-Vu Large MISC 1 each (1 each Other Not Given 03/27/23 1334)  albuterol (VENTOLIN HFA) 108 (90 Base) MCG/ACT inhaler 2 puff (2 puffs Inhalation Not Given 03/27/23 1425)  amoxicillin (AMOXIL) capsule 1,000 mg (1,000 mg Oral Given 03/27/23 1434)    ED Course/ Medical Decision Making/ A&P  Medical Decision Making This patient presents to the ED for concern of cough, aches, this involves an extensive number of treatment options, and is a complaint that carries with it a high risk of complications and morbidity.  The differential diagnosis includes COVID, pneumonia, viral URI, asthma exacerbation   Co morbidities that complicate the patient evaluation  Smoker, remote h/o asthma   Additional history obtained:   Lab Tests:  I Ordered, and personally interpreted labs.  The pertinent results include:  COVID - negative    Problem List / ED Course / Critical interventions / Medication management  Cough, night sweats, aches, congestion - c/w URI vs PNA  Ill for 7 days - feel he would benefit from abx use given duration of illness with worsening symptoms today. For this reason a CXR was not obtained. Normal VS, no hypoxia, tachycardia, fever  I ordered medication including  Amoxil  for infection    Social Determinants of Health:  Smoker, h/o asthma   Test / Admission - Considered:  Amoxicillin x 10 days, inhaler provided.     Risk Prescription drug management.           Final Clinical Impression(s) / ED Diagnoses Final diagnoses:  Bronchitis    Rx / DC Orders ED Discharge Orders          Ordered    amoxicillin (AMOXIL) 500 MG capsule  2 times daily        03/27/23 1436              Elpidio Anis, PA-C 03/27/23 1439    Elpidio Anis, PA-C 03/27/23 1439    Ernie Avena, MD 03/27/23 1529

## 2023-03-27 NOTE — ED Notes (Signed)
